# Patient Record
Sex: Female | Born: 1985
Health system: Southern US, Community
[De-identification: ages and names within clinical notes are randomized; demographics above are authoritative.]

## PROBLEM LIST (undated history)

## (undated) DIAGNOSIS — L859 Epidermal thickening, unspecified: Secondary | ICD-10-CM

## (undated) DIAGNOSIS — F419 Anxiety disorder, unspecified: Secondary | ICD-10-CM

## (undated) DIAGNOSIS — Z8719 Personal history of other diseases of the digestive system: Secondary | ICD-10-CM

## (undated) DIAGNOSIS — K219 Gastro-esophageal reflux disease without esophagitis: Secondary | ICD-10-CM

## (undated) DIAGNOSIS — Z973 Presence of spectacles and contact lenses: Secondary | ICD-10-CM

## (undated) DIAGNOSIS — G4700 Insomnia, unspecified: Secondary | ICD-10-CM

## (undated) DIAGNOSIS — K449 Diaphragmatic hernia without obstruction or gangrene: Secondary | ICD-10-CM

## (undated) DIAGNOSIS — Z8619 Personal history of other infectious and parasitic diseases: Secondary | ICD-10-CM

## (undated) DIAGNOSIS — F988 Other specified behavioral and emotional disorders with onset usually occurring in childhood and adolescence: Secondary | ICD-10-CM

## (undated) DIAGNOSIS — A6000 Herpesviral infection of urogenital system, unspecified: Secondary | ICD-10-CM

## (undated) DIAGNOSIS — G44009 Cluster headache syndrome, unspecified, not intractable: Secondary | ICD-10-CM

## (undated) DIAGNOSIS — R350 Frequency of micturition: Secondary | ICD-10-CM

## (undated) DIAGNOSIS — N9089 Other specified noninflammatory disorders of vulva and perineum: Secondary | ICD-10-CM

## (undated) HISTORY — DX: Gastro-esophageal reflux disease without esophagitis: K21.9

## (undated) HISTORY — DX: Other specified behavioral and emotional disorders with onset usually occurring in childhood and adolescence: F98.8

## (undated) HISTORY — DX: Insomnia, unspecified: G47.00

## (undated) HISTORY — DX: Diaphragmatic hernia without obstruction or gangrene: K44.9

## (undated) HISTORY — DX: Anxiety disorder, unspecified: F41.9

## (undated) HISTORY — PX: METATARSAL OSTEOTOMY WITH BUNIONECTOMY: SHX5662

---
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
## 2010-03-25 ENCOUNTER — Other Ambulatory Visit: Payer: Self-pay | Admitting: Family Medicine

## 2010-03-25 ENCOUNTER — Ambulatory Visit
Admission: RE | Admit: 2010-03-25 | Discharge: 2010-03-25 | Disposition: A | Discharge status: Home / Self Care | Payer: PRIVATE HEALTH INSURANCE | Source: Ambulatory Visit | Attending: Family Medicine | Admitting: Family Medicine

## 2010-03-25 DIAGNOSIS — R1012 Left upper quadrant pain: Secondary | ICD-10-CM

## 2010-03-25 MED ORDER — IOHEXOL 300 MG/ML  SOLN
100.0000 mL | Freq: Once | INTRAMUSCULAR | Status: AC | PRN
  Administered 2010-03-25: 100 mL via INTRAVENOUS

## 2010-10-09 ENCOUNTER — Ambulatory Visit (INDEPENDENT_AMBULATORY_CARE_PROVIDER_SITE_OTHER): Payer: Commercial Managed Care - PPO | Admitting: Licensed Clinical Social Worker

## 2010-10-09 DIAGNOSIS — F341 Dysthymic disorder: Secondary | ICD-10-CM

## 2010-10-14 ENCOUNTER — Ambulatory Visit (INDEPENDENT_AMBULATORY_CARE_PROVIDER_SITE_OTHER): Payer: PRIVATE HEALTH INSURANCE | Admitting: Licensed Clinical Social Worker

## 2010-10-14 DIAGNOSIS — F329 Major depressive disorder, single episode, unspecified: Secondary | ICD-10-CM

## 2010-10-28 ENCOUNTER — Ambulatory Visit (INDEPENDENT_AMBULATORY_CARE_PROVIDER_SITE_OTHER): Payer: Commercial Managed Care - PPO | Admitting: Licensed Clinical Social Worker

## 2010-10-28 DIAGNOSIS — F329 Major depressive disorder, single episode, unspecified: Secondary | ICD-10-CM

## 2010-12-10 ENCOUNTER — Ambulatory Visit (HOSPITAL_BASED_OUTPATIENT_CLINIC_OR_DEPARTMENT_OTHER): Payer: 59 | Attending: Family Medicine | Admitting: Radiology

## 2010-12-10 VITALS — Ht 67.0 in | Wt 145.0 lb

## 2010-12-10 DIAGNOSIS — G4710 Hypersomnia, unspecified: Secondary | ICD-10-CM | POA: Insufficient documentation

## 2010-12-10 DIAGNOSIS — R059 Cough, unspecified: Secondary | ICD-10-CM | POA: Insufficient documentation

## 2010-12-10 DIAGNOSIS — R0989 Other specified symptoms and signs involving the circulatory and respiratory systems: Secondary | ICD-10-CM | POA: Insufficient documentation

## 2010-12-10 DIAGNOSIS — R05 Cough: Secondary | ICD-10-CM | POA: Insufficient documentation

## 2010-12-10 DIAGNOSIS — R0609 Other forms of dyspnea: Secondary | ICD-10-CM | POA: Insufficient documentation

## 2010-12-10 DIAGNOSIS — G4730 Sleep apnea, unspecified: Secondary | ICD-10-CM | POA: Insufficient documentation

## 2010-12-10 DIAGNOSIS — Z79899 Other long term (current) drug therapy: Secondary | ICD-10-CM | POA: Insufficient documentation

## 2010-12-14 DIAGNOSIS — G4710 Hypersomnia, unspecified: Secondary | ICD-10-CM

## 2010-12-14 DIAGNOSIS — G4730 Sleep apnea, unspecified: Secondary | ICD-10-CM

## 2010-12-14 DIAGNOSIS — M62838 Other muscle spasm: Secondary | ICD-10-CM

## 2010-12-14 HISTORY — PX: OTHER SURGICAL HISTORY: SHX169

## 2010-12-15 NOTE — Procedures (Addendum)
NAME:  EVELLA, KASAL                  ACCOUNT NO.:  [id number]  MEDICAL RECORD NO.:  [id number]         PATIENT TYPE:  OUT  LOCATION:  SLEEP CENTER                 FACILITY:  [facility]  PHYSICIAN:  Yarelli Decelles D. Maple Hudson, MD, FCCP, FACPDATE OF BIRTH:  08/03/1984  DATE OF STUDY:  12/10/2010                           NOCTURNAL POLYSOMNOGRAM  REFERRING PHYSICIAN:  Molly Maduro A. Nicholos Johns, M.D.  INDICATION FOR STUDY:  Hypersomnia with sleep apnea.  EPWORTH SLEEPINESS SCORE:  Epworth sleepiness score 9/24.  BMI 22.7, weight 145 pounds, height 67 inches, neck 12.5 inch.  Home medications are charted and reviewed.  MEDICATIONS:  SLEEP ARCHITECTURE:  Total sleep time 335 minutes with sleep efficiency 87.2%.  Stage I was 7%, stage II 72.5%, stage III 0.4%, REM 20% of total sleep time.  Sleep latency 17 minutes, REM latency 112.5 minutes, awake after sleep onset 32 minutes, arousal index 14.1.  BEDTIME MEDICATION:  Trazodone and ranitidine.  RESPIRATORY DATA:  Apnea-hypopnea index (AHI) 2.5 per hour.  A total of 14 events were scored including 4 obstructive apneas and 10 hypopneas. All events were associated with supine sleep position.  REM AHI 0.9 per hour, RDI 6.1 per hour.  There were insufficient numbers of events to qualify for application of split protocol, CPAP titration on this study night.  OXYGEN DATA:  Mild snoring with oxygen desaturation to a nadir of 91% and a mean oxygen saturation through the study of 96.4% on room air.  CARDIAC DATA:  Sinus rhythm with rare PVC and PAC.  MOVEMENT-PARASOMNIA:  A few limb jerks were noted with insignificant effect on sleep.  Bathroom x1.  Talking was noted during brief arousals. Frequent coughing noted throughout the study.  IMPRESSIONS-RECOMMENDATIONS: 1. Sleep architecture was significant for a number of brief     nonspecific spontaneous wakings, some associated with sleep talking     and frequent cough. 2. Occasional respiratory event with sleep  disturbance, within normal     limits, AHI 2.5 per hour (normal range for adults is between 0 and     5 events per hour).  Mild snoring with oxygen desaturation to a     nadir of 91% and mean oxygen saturation through the study of 96.4%     on room air which is normal. 3. There were insufficient numbers of scored respiratory events to     qualify for application of split protocol, CPAP titration on this     study night. 4. Consider managing with an emphasis on addressing cough as a basis     for arousal with question of nocturnal reflux or asthma.     Helen Cuff D. Maple Hudson, MD, [facility], FACP Diplomate, Biomedical engineer of Sleep Medicine Electronically Signed    CDY/MEDQ  D:  12/14/2010 09:13:46  T:  12/14/2010 10:15:41  Job:  [id number]

## 2011-03-19 ENCOUNTER — Ambulatory Visit (INDEPENDENT_AMBULATORY_CARE_PROVIDER_SITE_OTHER): Payer: 59 | Admitting: Licensed Clinical Social Worker

## 2011-03-19 DIAGNOSIS — F329 Major depressive disorder, single episode, unspecified: Secondary | ICD-10-CM

## 2011-03-19 DIAGNOSIS — F3289 Other specified depressive episodes: Secondary | ICD-10-CM

## 2011-03-26 ENCOUNTER — Ambulatory Visit (INDEPENDENT_AMBULATORY_CARE_PROVIDER_SITE_OTHER): Payer: 59 | Admitting: Licensed Clinical Social Worker

## 2011-03-26 DIAGNOSIS — F341 Dysthymic disorder: Secondary | ICD-10-CM

## 2011-04-03 ENCOUNTER — Ambulatory Visit (INDEPENDENT_AMBULATORY_CARE_PROVIDER_SITE_OTHER): Payer: 59 | Admitting: Licensed Clinical Social Worker

## 2011-04-03 DIAGNOSIS — F341 Dysthymic disorder: Secondary | ICD-10-CM

## 2011-04-16 ENCOUNTER — Ambulatory Visit (INDEPENDENT_AMBULATORY_CARE_PROVIDER_SITE_OTHER): Payer: 59 | Admitting: Licensed Clinical Social Worker

## 2011-04-16 DIAGNOSIS — F341 Dysthymic disorder: Secondary | ICD-10-CM

## 2011-04-23 ENCOUNTER — Ambulatory Visit (INDEPENDENT_AMBULATORY_CARE_PROVIDER_SITE_OTHER): Payer: 59 | Admitting: Licensed Clinical Social Worker

## 2011-04-23 DIAGNOSIS — F341 Dysthymic disorder: Secondary | ICD-10-CM

## 2011-05-14 ENCOUNTER — Ambulatory Visit (INDEPENDENT_AMBULATORY_CARE_PROVIDER_SITE_OTHER): Payer: 59 | Admitting: Licensed Clinical Social Worker

## 2011-05-14 DIAGNOSIS — F341 Dysthymic disorder: Secondary | ICD-10-CM

## 2011-06-06 ENCOUNTER — Other Ambulatory Visit: Payer: Self-pay | Admitting: Obstetrics and Gynecology

## 2011-06-06 ENCOUNTER — Other Ambulatory Visit (HOSPITAL_COMMUNITY)
Admission: RE | Admit: 2011-06-06 | Discharge: 2011-06-06 | Disposition: A | Discharge status: Home / Self Care | Payer: 59 | Source: Ambulatory Visit | Attending: Obstetrics and Gynecology | Admitting: Obstetrics and Gynecology

## 2011-06-06 DIAGNOSIS — Z113 Encounter for screening for infections with a predominantly sexual mode of transmission: Secondary | ICD-10-CM | POA: Insufficient documentation

## 2011-06-06 DIAGNOSIS — Z01419 Encounter for gynecological examination (general) (routine) without abnormal findings: Secondary | ICD-10-CM | POA: Insufficient documentation

## 2011-06-10 ENCOUNTER — Ambulatory Visit (INDEPENDENT_AMBULATORY_CARE_PROVIDER_SITE_OTHER): Payer: 59 | Admitting: Licensed Clinical Social Worker

## 2011-06-10 DIAGNOSIS — F341 Dysthymic disorder: Secondary | ICD-10-CM

## 2011-06-25 ENCOUNTER — Other Ambulatory Visit: Payer: Self-pay | Admitting: Obstetrics and Gynecology

## 2011-07-02 ENCOUNTER — Ambulatory Visit (INDEPENDENT_AMBULATORY_CARE_PROVIDER_SITE_OTHER): Payer: 59 | Admitting: Licensed Clinical Social Worker

## 2011-07-02 DIAGNOSIS — F341 Dysthymic disorder: Secondary | ICD-10-CM

## 2011-08-06 ENCOUNTER — Ambulatory Visit (INDEPENDENT_AMBULATORY_CARE_PROVIDER_SITE_OTHER): Payer: 59 | Admitting: Licensed Clinical Social Worker

## 2011-08-06 DIAGNOSIS — F341 Dysthymic disorder: Secondary | ICD-10-CM

## 2011-10-07 ENCOUNTER — Ambulatory Visit (INDEPENDENT_AMBULATORY_CARE_PROVIDER_SITE_OTHER): Payer: 59 | Admitting: Licensed Clinical Social Worker

## 2011-10-07 DIAGNOSIS — F341 Dysthymic disorder: Secondary | ICD-10-CM

## 2012-06-09 ENCOUNTER — Other Ambulatory Visit: Payer: Self-pay | Admitting: Obstetrics and Gynecology

## 2012-06-09 ENCOUNTER — Other Ambulatory Visit (HOSPITAL_COMMUNITY)
Admission: RE | Admit: 2012-06-09 | Discharge: 2012-06-09 | Disposition: A | Discharge status: Home / Self Care | Payer: 59 | Source: Ambulatory Visit | Attending: Obstetrics and Gynecology | Admitting: Obstetrics and Gynecology

## 2012-06-09 DIAGNOSIS — Z01419 Encounter for gynecological examination (general) (routine) without abnormal findings: Secondary | ICD-10-CM | POA: Insufficient documentation

## 2012-11-04 ENCOUNTER — Ambulatory Visit (INDEPENDENT_AMBULATORY_CARE_PROVIDER_SITE_OTHER): Payer: 59 | Admitting: Podiatry

## 2012-11-04 ENCOUNTER — Ambulatory Visit (INDEPENDENT_AMBULATORY_CARE_PROVIDER_SITE_OTHER): Payer: 59

## 2012-11-04 ENCOUNTER — Encounter: Payer: Self-pay | Admitting: Podiatry

## 2012-11-04 VITALS — BP 120/45 | HR 74 | Resp 16 | Ht 67.0 in | Wt 157.0 lb

## 2012-11-04 DIAGNOSIS — M21619 Bunion of unspecified foot: Secondary | ICD-10-CM

## 2012-11-04 DIAGNOSIS — R52 Pain, unspecified: Secondary | ICD-10-CM

## 2012-11-04 DIAGNOSIS — M2010 Hallux valgus (acquired), unspecified foot: Secondary | ICD-10-CM

## 2012-11-04 DIAGNOSIS — M7750 Other enthesopathy of unspecified foot: Secondary | ICD-10-CM

## 2012-11-04 DIAGNOSIS — M21611 Bunion of right foot: Secondary | ICD-10-CM

## 2012-11-04 NOTE — Progress Notes (Signed)
Subjective:     Patient ID: Holly Lynch, female   DOB: 1985/05/20, 27 y.o.   MRN: [id number]  HPI patient presents on referral from Dr. Renato Gails stating that my bunion is becoming more painful on my right foot and I get pain in my legs if I walk a lot. States the symptoms have gotten worse over the last several months she's had the bunion deformity for years. Patient has had foot problems for a long time   Review of Systems  All other systems reviewed and are negative.       Objective:   Physical Exam  Nursing note and vitals reviewed. Constitutional: She appears well-developed and well-nourished.  Cardiovascular: Intact distal pulses.   Musculoskeletal: Normal range of motion.  Neurological: She is alert.  Skin: Skin is warm.   muscle strength was found to be adequate of all muscle groups and I noted there is some depression of the arch on weightbearing. Large hyperostosis medial aspect first metatarsal head right over left with redness around the first metatarsal and pain with pressure. 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 Patient would like to get this fixed on the right foot but needs to wait until December. Reviewed the surgery and recovery with 2 weeks of significant reduction of walking. Total recovery will take 6 months to one year. Scanned for custom orthotics to reduce stress on her feet and hopefully prevent the left from progressing

## 2012-11-04 NOTE — Patient Instructions (Signed)
We will call you when orthotics are ready

## 2012-11-11 ENCOUNTER — Telehealth: Payer: Self-pay | Admitting: *Deleted

## 2012-11-11 NOTE — Telephone Encounter (Signed)
"  My supervisor wants me to get FMLA forms filled out in case after surgery.  If I fax them to you can you fill them out?  Give me a call at [phone]."

## 2012-11-25 ENCOUNTER — Other Ambulatory Visit: Payer: Self-pay

## 2012-11-29 ENCOUNTER — Encounter: Payer: Self-pay | Admitting: Podiatry

## 2012-11-29 ENCOUNTER — Ambulatory Visit (INDEPENDENT_AMBULATORY_CARE_PROVIDER_SITE_OTHER): Payer: 59 | Admitting: Podiatry

## 2012-11-29 VITALS — BP 105/60 | HR 82 | Resp 12

## 2012-11-29 DIAGNOSIS — M7750 Other enthesopathy of unspecified foot: Secondary | ICD-10-CM

## 2012-11-29 DIAGNOSIS — M2010 Hallux valgus (acquired), unspecified foot: Secondary | ICD-10-CM

## 2012-11-29 DIAGNOSIS — M21619 Bunion of unspecified foot: Secondary | ICD-10-CM

## 2012-11-29 NOTE — Patient Instructions (Signed)

## 2012-12-01 NOTE — Progress Notes (Signed)
Subjective:     Patient ID: Holly Lynch, female   DOB: 1985/02/27, 27 y.o.   MRN: [id number]  Foot Pain   patient presents stating I'm here for my orthotics and also to schedule the surgery on my right foot. States the bunion has been bothering her quite a bit and the bone on the outside right over left foot   Review of Systems  All other systems reviewed and are negative.       Objective:   Physical Exam  Nursing note and vitals reviewed. Constitutional: She is oriented to person, place, and time.  Cardiovascular: Intact distal pulses.   Musculoskeletal: Normal range of motion.  Neurological: She is oriented to person, place, and time.  Skin: Skin is warm.   patient is found to have structural deformity of the right and left foot with redness and pain around the first metatarsal head and fifth metatarsal head bilateral with x-rays confirming structural malalignment. General health is excellent     Assessment:     HAV deformity bilateral and tailor's bunion deformity bilateral right over left    Plan:     Reviewed condition and today dispensed orthotics with instructions. Patient would like surgery performed in December and at this time I allowed her to read a consent form for correction of the right foot consisting of Austin bunionectomy with pin and fifth metatarsal osteotomy with screw. Reviewed all possible complications that are listed and the fact that total recovery. We'll take 6 months to one year patient understands this and signs consent form. At this time care fracture walker dispensed with all instructions for usage and she is to practice with it prior to the procedures all preoperative instructions given the patient and she is encouraged to call with any questions prior to surgery

## 2012-12-14 ENCOUNTER — Telehealth: Payer: Self-pay | Admitting: *Deleted

## 2012-12-14 NOTE — Telephone Encounter (Signed)
Pt states she faxed her short-term disability paper work to [employer].

## 2012-12-21 ENCOUNTER — Encounter: Payer: Self-pay | Admitting: Podiatry

## 2012-12-21 DIAGNOSIS — M21619 Bunion of unspecified foot: Secondary | ICD-10-CM

## 2012-12-21 DIAGNOSIS — M2010 Hallux valgus (acquired), unspecified foot: Secondary | ICD-10-CM

## 2012-12-22 ENCOUNTER — Telehealth: Payer: Self-pay | Admitting: *Deleted

## 2012-12-22 NOTE — Telephone Encounter (Signed)
Pt states had surgery yesterday, toes are still numb, but capillary refill is good.  I informed pt that she may b numb for 72 hours post-op, that good capillary refill was a good sign, follow the post-op instructions and begin pain medication when discomfort 1st starts.  Pt states understanding.

## 2012-12-27 ENCOUNTER — Encounter: Payer: Self-pay | Admitting: Podiatry

## 2012-12-27 ENCOUNTER — Ambulatory Visit: Payer: 59 | Admitting: Podiatry

## 2012-12-27 ENCOUNTER — Ambulatory Visit (INDEPENDENT_AMBULATORY_CARE_PROVIDER_SITE_OTHER): Payer: 59

## 2012-12-27 VITALS — BP 96/49 | HR 77 | Resp 16

## 2012-12-27 DIAGNOSIS — Z9889 Other specified postprocedural states: Secondary | ICD-10-CM

## 2012-12-27 DIAGNOSIS — M21611 Bunion of right foot: Secondary | ICD-10-CM

## 2012-12-27 DIAGNOSIS — M21619 Bunion of unspecified foot: Secondary | ICD-10-CM

## 2012-12-27 DIAGNOSIS — M2010 Hallux valgus (acquired), unspecified foot: Secondary | ICD-10-CM

## 2012-12-27 NOTE — Progress Notes (Signed)
Subjective:     Patient ID: Holly Lynch, female   DOB: 09/26/1985, 27 y.o.   MRN: [id number]  HPI patient states that I'm doing well with my foot and while having pain it is not as bad as I expect   Review of Systems     Objective:   Physical Exam Neurovascular status intact with negative Homans sign noted. Patient's right foot is healing well with mild edema noted and normal amount of discoloration and bruising with good range of motion first MPJ and good structural clinical alignment    Assessment:     Doing very well 1 week postop structural correction of the forefoot right    Plan:     X-rays reviewed with patient and sterile dressing reapplied along with Darco shoe for continued compression. Patient will continue with reduced activity and be seen back in 3 weeks

## 2013-01-10 ENCOUNTER — Telehealth: Payer: Self-pay | Admitting: *Deleted

## 2013-01-10 NOTE — Telephone Encounter (Signed)
Pt states her foot popped in the joint while she was showering.  Dr Charlsie Merles states go stay in the shoe.  I informed pt and she agreed.

## 2013-01-11 ENCOUNTER — Telehealth: Payer: Self-pay | Admitting: *Deleted

## 2013-01-11 NOTE — Telephone Encounter (Signed)
PT WAS SCHED TO COME IN 12-29 RESCH TO 12-24

## 2013-01-11 NOTE — Telephone Encounter (Signed)
Pt states she feels the toe of the bunion surgery foot is drifting into the 2nd toe.  I referred to the scheduler for an appt with Dr. Charlsie Merles

## 2013-01-12 ENCOUNTER — Ambulatory Visit (INDEPENDENT_AMBULATORY_CARE_PROVIDER_SITE_OTHER): Payer: 59

## 2013-01-12 ENCOUNTER — Ambulatory Visit (INDEPENDENT_AMBULATORY_CARE_PROVIDER_SITE_OTHER): Payer: 59 | Admitting: Podiatry

## 2013-01-12 ENCOUNTER — Encounter: Payer: Self-pay | Admitting: Podiatry

## 2013-01-12 VITALS — BP 118/69 | HR 101 | Resp 16 | Ht 67.0 in | Wt 160.0 lb

## 2013-01-12 DIAGNOSIS — M21611 Bunion of right foot: Secondary | ICD-10-CM

## 2013-01-12 DIAGNOSIS — M2010 Hallux valgus (acquired), unspecified foot: Secondary | ICD-10-CM

## 2013-01-12 DIAGNOSIS — M21619 Bunion of unspecified foot: Secondary | ICD-10-CM

## 2013-01-12 NOTE — Progress Notes (Signed)
Subjective:     Patient ID: Holly Lynch, female   DOB: 10-21-1985, 27 y.o.   MRN: [id number]  HPI patient states that she is doing well after surgery and would like to increase her activities. She has been wearing her surgical shoe and has done some physical activity   Review of Systems     Objective:   Physical Exam Neurovascular status intact with   good structural alignment with the incision sites on the first and fifth metatarsals healing well Assessment:     Doing well post Austin bunionectomy and metatarsal osteotomy fifth right    Plan:     X-rays reviewed and anklet dispensed with instructions on range of motion exercises and gradual usage of tennis shoes at this time. Reappoint 4 weeks earlier if any issues should occur

## 2013-01-17 ENCOUNTER — Encounter: Payer: 59 | Admitting: Podiatry

## 2013-01-31 NOTE — Progress Notes (Signed)
1) Austin bunionectomy right foot  2) Metatarsal osteotomy 5th met right foot

## 2013-02-02 ENCOUNTER — Encounter: Payer: 59 | Admitting: Podiatry

## 2013-02-03 ENCOUNTER — Ambulatory Visit (INDEPENDENT_AMBULATORY_CARE_PROVIDER_SITE_OTHER): Payer: 59 | Admitting: Podiatry

## 2013-02-03 ENCOUNTER — Encounter: Payer: Self-pay | Admitting: Podiatry

## 2013-02-03 ENCOUNTER — Ambulatory Visit (INDEPENDENT_AMBULATORY_CARE_PROVIDER_SITE_OTHER): Payer: 59

## 2013-02-03 VITALS — BP 104/72 | HR 90 | Resp 12

## 2013-02-03 DIAGNOSIS — Z9889 Other specified postprocedural states: Secondary | ICD-10-CM

## 2013-02-03 DIAGNOSIS — M21619 Bunion of unspecified foot: Secondary | ICD-10-CM

## 2013-02-03 DIAGNOSIS — M2010 Hallux valgus (acquired), unspecified foot: Secondary | ICD-10-CM

## 2013-02-03 NOTE — Progress Notes (Signed)
Subjective:     Patient ID: Holly Lynch, female   DOB: 02/04/1985, 28 y.o.   MRN: [id number]  HPI patient presents stating I am doing well with my right foot and would like to resume more normal activity. Several months after foot surgery   Review of Systems     Objective:   Physical Exam Neurovascular status unchanged with incision site right first and fifth metatarsals which are healing well in good range of motion first MPJ right with no crepitus within the joint    Assessment:     Healing well post surgery right foot    Plan:     X-ray taken reviewed and allow this patient to gradually resume more normal activity reappoint for us to recheck

## 2013-02-07 ENCOUNTER — Encounter: Payer: 59 | Admitting: Podiatry

## 2013-04-21 ENCOUNTER — Other Ambulatory Visit: Payer: Self-pay | Admitting: Nurse Practitioner

## 2013-09-15 ENCOUNTER — Encounter (HOSPITAL_COMMUNITY): Payer: Self-pay | Admitting: *Deleted

## 2013-09-15 ENCOUNTER — Telehealth: Payer: Self-pay | Admitting: *Deleted

## 2013-09-15 NOTE — Telephone Encounter (Signed)
I had foot surgery back in December 21, 2012.  I'm starting to have foot pain back in that foot again, same spot before the surgery.  I don't know if I need to come back in or anything.  Dr. Charlsie Merles, Bunion surgery in December, no pain until the last few weeks.  Please give me a call.

## 2013-09-16 NOTE — Telephone Encounter (Signed)
I called and apologized for calling her so late.  I told her she probably needs to schedule an appointment to come in a see Dr. Charlsie Merles.  I asked her to call on Monday to schedule an appointment.  She stated all right.

## 2013-09-19 ENCOUNTER — Encounter (HOSPITAL_COMMUNITY): Payer: Self-pay | Admitting: Pharmacist

## 2013-09-19 NOTE — Telephone Encounter (Signed)
Called patient and left a message for her to call back and schedule an appointment.

## 2013-09-28 ENCOUNTER — Ambulatory Visit (HOSPITAL_COMMUNITY)
Admission: RE | Admit: 2013-09-28 | Discharge: 2013-09-28 | Disposition: A | Discharge status: Home / Self Care | Payer: 59 | Source: Ambulatory Visit | Attending: Obstetrics and Gynecology | Admitting: Obstetrics and Gynecology

## 2013-09-28 ENCOUNTER — Ambulatory Visit (HOSPITAL_COMMUNITY): Payer: 59 | Admitting: Anesthesiology

## 2013-09-28 ENCOUNTER — Ambulatory Visit (INDEPENDENT_AMBULATORY_CARE_PROVIDER_SITE_OTHER): Payer: 59

## 2013-09-28 ENCOUNTER — Ambulatory Visit (INDEPENDENT_AMBULATORY_CARE_PROVIDER_SITE_OTHER): Payer: 59 | Admitting: Podiatry

## 2013-09-28 ENCOUNTER — Other Ambulatory Visit: Payer: Self-pay | Admitting: Podiatry

## 2013-09-28 ENCOUNTER — Encounter: Payer: Self-pay | Admitting: Podiatry

## 2013-09-28 ENCOUNTER — Encounter (HOSPITAL_COMMUNITY): Payer: 59 | Admitting: Anesthesiology

## 2013-09-28 ENCOUNTER — Encounter (HOSPITAL_COMMUNITY)
Admission: RE | Disposition: A | Discharge status: Home / Self Care | Payer: Self-pay | Source: Ambulatory Visit | Attending: Obstetrics and Gynecology

## 2013-09-28 ENCOUNTER — Encounter (HOSPITAL_COMMUNITY): Payer: Self-pay | Admitting: Anesthesiology

## 2013-09-28 VITALS — BP 118/72 | HR 71 | Resp 16

## 2013-09-28 DIAGNOSIS — M21612 Bunion of left foot: Secondary | ICD-10-CM

## 2013-09-28 DIAGNOSIS — F988 Other specified behavioral and emotional disorders with onset usually occurring in childhood and adolescence: Secondary | ICD-10-CM | POA: Diagnosis not present

## 2013-09-28 DIAGNOSIS — A630 Anogenital (venereal) warts: Secondary | ICD-10-CM | POA: Diagnosis present

## 2013-09-28 DIAGNOSIS — K208 Other esophagitis without bleeding: Secondary | ICD-10-CM | POA: Diagnosis not present

## 2013-09-28 DIAGNOSIS — K219 Gastro-esophageal reflux disease without esophagitis: Secondary | ICD-10-CM | POA: Diagnosis not present

## 2013-09-28 DIAGNOSIS — A6000 Herpesviral infection of urogenital system, unspecified: Secondary | ICD-10-CM | POA: Insufficient documentation

## 2013-09-28 DIAGNOSIS — N898 Other specified noninflammatory disorders of vagina: Secondary | ICD-10-CM | POA: Diagnosis not present

## 2013-09-28 DIAGNOSIS — F329 Major depressive disorder, single episode, unspecified: Secondary | ICD-10-CM | POA: Insufficient documentation

## 2013-09-28 DIAGNOSIS — M2010 Hallux valgus (acquired), unspecified foot: Secondary | ICD-10-CM

## 2013-09-28 DIAGNOSIS — M2012 Hallux valgus (acquired), left foot: Secondary | ICD-10-CM

## 2013-09-28 DIAGNOSIS — M2011 Hallux valgus (acquired), right foot: Secondary | ICD-10-CM

## 2013-09-28 DIAGNOSIS — M2040 Other hammer toe(s) (acquired), unspecified foot: Secondary | ICD-10-CM

## 2013-09-28 DIAGNOSIS — F3289 Other specified depressive episodes: Secondary | ICD-10-CM | POA: Insufficient documentation

## 2013-09-28 DIAGNOSIS — M21619 Bunion of unspecified foot: Secondary | ICD-10-CM

## 2013-09-28 HISTORY — PX: BIOPSY: SHX5522

## 2013-09-28 HISTORY — PX: LASER ABLATION CONDOLAMATA: SHX5941

## 2013-09-28 LAB — CBC
HEMATOCRIT: 37 % (ref 36.0–46.0)
Hemoglobin: 11.5 g/dL — ABNORMAL LOW (ref 12.0–15.0)
MCH: 24.7 pg — ABNORMAL LOW (ref 26.0–34.0)
MCHC: 31.1 g/dL (ref 30.0–36.0)
MCV: 79.6 fL (ref 78.0–100.0)
Platelets: 379 10*3/uL (ref 150–400)
RBC: 4.65 MIL/uL (ref 3.87–5.11)
RDW: 16 % — ABNORMAL HIGH (ref 11.5–15.5)
WBC: 3.9 10*3/uL — AB (ref 4.0–10.5)

## 2013-09-28 LAB — PREGNANCY, URINE: Preg Test, Ur: NEGATIVE

## 2013-09-28 SURGERY — ABLATION, CONDYLOMA, USING LASER
Anesthesia: General | Site: Vulva

## 2013-09-28 MED ORDER — MEPERIDINE HCL 25 MG/ML IJ SOLN: 6.2500 mg | INTRAMUSCULAR | Status: DC | PRN

## 2013-09-28 MED ORDER — IODINE STRONG (LUGOLS) 5 % PO SOLN
ORAL | Status: AC
  Filled 2013-09-28: qty 1

## 2013-09-28 MED ORDER — EPHEDRINE SULFATE 50 MG/ML IJ SOLN
INTRAMUSCULAR | Status: DC | PRN
  Administered 2013-09-28: 10 mg via INTRAVENOUS

## 2013-09-28 MED ORDER — OXYCODONE-ACETAMINOPHEN 5-325 MG PO TABS: 1.0000 | ORAL_TABLET | ORAL | Status: DC | PRN

## 2013-09-28 MED ORDER — KETOROLAC TROMETHAMINE 30 MG/ML IJ SOLN
INTRAMUSCULAR | Status: DC | PRN
  Administered 2013-09-28: 30 mg via INTRAVENOUS

## 2013-09-28 MED ORDER — SILVER SULFADIAZINE 1 % EX CREA
TOPICAL_CREAM | CUTANEOUS | Status: DC | PRN
  Administered 2013-09-28: 1 via TOPICAL

## 2013-09-28 MED ORDER — FENTANYL CITRATE 0.05 MG/ML IJ SOLN
INTRAMUSCULAR | Status: AC
  Filled 2013-09-28: qty 2

## 2013-09-28 MED ORDER — HYDROMORPHONE HCL PF 1 MG/ML IJ SOLN
INTRAMUSCULAR | Status: AC
  Filled 2013-09-28: qty 1

## 2013-09-28 MED ORDER — HYDROMORPHONE HCL PF 1 MG/ML IJ SOLN
INTRAMUSCULAR | Status: DC | PRN
  Administered 2013-09-28: 1 mg via INTRAVENOUS

## 2013-09-28 MED ORDER — LIDOCAINE HCL (CARDIAC) 20 MG/ML IV SOLN
INTRAVENOUS | Status: AC
  Filled 2013-09-28: qty 5

## 2013-09-28 MED ORDER — MIDAZOLAM HCL 2 MG/2ML IJ SOLN
INTRAMUSCULAR | Status: AC
  Filled 2013-09-28: qty 2

## 2013-09-28 MED ORDER — ONDANSETRON HCL 4 MG/2ML IJ SOLN
INTRAMUSCULAR | Status: DC | PRN
  Administered 2013-09-28: 4 mg via INTRAVENOUS

## 2013-09-28 MED ORDER — KETOROLAC TROMETHAMINE 30 MG/ML IJ SOLN
INTRAMUSCULAR | Status: AC
  Filled 2013-09-28: qty 1

## 2013-09-28 MED ORDER — ONDANSETRON HCL 4 MG/2ML IJ SOLN: 4.0000 mg | Freq: Once | INTRAMUSCULAR | Status: DC | PRN

## 2013-09-28 MED ORDER — BUPIVACAINE HCL 0.25 % IJ SOLN
INTRAMUSCULAR | Status: DC | PRN
  Administered 2013-09-28: 7 mL

## 2013-09-28 MED ORDER — FENTANYL CITRATE 0.05 MG/ML IJ SOLN
INTRAMUSCULAR | Status: DC | PRN
  Administered 2013-09-28 (×2): 50 ug via INTRAVENOUS

## 2013-09-28 MED ORDER — DEXAMETHASONE SODIUM PHOSPHATE 4 MG/ML IJ SOLN
INTRAMUSCULAR | Status: AC
  Filled 2013-09-28: qty 1

## 2013-09-28 MED ORDER — LIDOCAINE HCL (CARDIAC) 20 MG/ML IV SOLN
INTRAVENOUS | Status: DC | PRN
  Administered 2013-09-28: 30 mg via INTRAVENOUS
  Administered 2013-09-28: 70 mg via INTRAVENOUS

## 2013-09-28 MED ORDER — PROPOFOL 10 MG/ML IV BOLUS
INTRAVENOUS | Status: DC | PRN
  Administered 2013-09-28: 170 mg via INTRAVENOUS

## 2013-09-28 MED ORDER — KETOROLAC TROMETHAMINE 30 MG/ML IJ SOLN: 15.0000 mg | Freq: Once | INTRAMUSCULAR | Status: DC | PRN

## 2013-09-28 MED ORDER — FENTANYL CITRATE 0.05 MG/ML IJ SOLN: 25.0000 ug | INTRAMUSCULAR | Status: DC | PRN

## 2013-09-28 MED ORDER — MIDAZOLAM HCL 2 MG/2ML IJ SOLN
INTRAMUSCULAR | Status: DC | PRN
  Administered 2013-09-28: 2 mg via INTRAVENOUS

## 2013-09-28 MED ORDER — BUPIVACAINE HCL (PF) 0.25 % IJ SOLN
INTRAMUSCULAR | Status: AC
Start: 2013-09-28 — End: 2013-09-28
  Filled 2013-09-28: qty 30

## 2013-09-28 MED ORDER — ONDANSETRON HCL 4 MG/2ML IJ SOLN
INTRAMUSCULAR | Status: AC
  Filled 2013-09-28: qty 2

## 2013-09-28 MED ORDER — MEPERIDINE HCL 50 MG PO TABS: 50.0000 mg | ORAL_TABLET | ORAL | Status: DC | PRN

## 2013-09-28 MED ORDER — CEFAZOLIN SODIUM-DEXTROSE 2-3 GM-% IV SOLR
2.0000 g | INTRAVENOUS | Status: AC
  Administered 2013-09-28: 2 g via INTRAVENOUS

## 2013-09-28 MED ORDER — DEXAMETHASONE SODIUM PHOSPHATE 10 MG/ML IJ SOLN
INTRAMUSCULAR | Status: DC | PRN
  Administered 2013-09-28: 4 mg via INTRAVENOUS

## 2013-09-28 MED ORDER — SCOPOLAMINE 1 MG/3DAYS TD PT72: 1.0000 | MEDICATED_PATCH | Freq: Once | TRANSDERMAL | Status: DC

## 2013-09-28 MED ORDER — EPHEDRINE 5 MG/ML INJ
INTRAVENOUS | Status: AC
  Filled 2013-09-28: qty 10

## 2013-09-28 MED ORDER — LACTATED RINGERS IV SOLN
INTRAVENOUS | Status: DC
  Administered 2013-09-28 (×2): via INTRAVENOUS

## 2013-09-28 MED ORDER — SILVER NITRATE-POT NITRATE 75-25 % EX MISC
CUTANEOUS | Status: AC
  Filled 2013-09-28: qty 1

## 2013-09-28 MED ORDER — ACETIC ACID 5 % SOLN
Status: AC
  Filled 2013-09-28: qty 500

## 2013-09-28 MED ORDER — IBUPROFEN 600 MG PO TABS: 600.0000 mg | ORAL_TABLET | Freq: Four times a day (QID) | ORAL | Status: DC | PRN

## 2013-09-28 MED ORDER — FERRIC SUBSULFATE 259 MG/GM EX SOLN
CUTANEOUS | Status: AC
  Filled 2013-09-28: qty 8

## 2013-09-28 MED ORDER — SILVER SULFADIAZINE 1 % EX CREA
TOPICAL_CREAM | CUTANEOUS | Status: AC
  Filled 2013-09-28: qty 50

## 2013-09-28 MED ORDER — SILVER NITRATE-POT NITRATE 75-25 % EX MISC
CUTANEOUS | Status: DC | PRN
  Administered 2013-09-28 (×2): 1

## 2013-09-28 MED ORDER — CEFAZOLIN SODIUM-DEXTROSE 2-3 GM-% IV SOLR
INTRAVENOUS | Status: AC
  Filled 2013-09-28: qty 50

## 2013-09-28 SURGICAL SUPPLY — 21 items
APPLICATOR COTTON TIP 6IN STRL (MISCELLANEOUS) ×8 IMPLANT
CATH ROBINSON RED A/P 16FR (CATHETERS) ×4 IMPLANT
CLOTH BEACON ORANGE TIMEOUT ST (SAFETY) ×4 IMPLANT
DEPRESSOR TONGUE BLADE STERILE (MISCELLANEOUS) ×8 IMPLANT
DRSG TELFA 3X8 NADH (GAUZE/BANDAGES/DRESSINGS) ×4 IMPLANT
GLOVE BIO SURGEON STRL SZ7 (GLOVE) ×4 IMPLANT
GLOVE BIOGEL PI IND STRL 7.0 (GLOVE) ×2 IMPLANT
GLOVE BIOGEL PI INDICATOR 7.0 (GLOVE) ×2
GOWN STRL REUS W/TWL LRG LVL3 (GOWN DISPOSABLE) ×8 IMPLANT
HOSE NS SMOKE EVAC 7/8 X6 (MISCELLANEOUS) ×3 IMPLANT
HOSE NS SMOKE EVAC 7/8 X6' (MISCELLANEOUS) ×1
PACK VAGINAL MINOR WOMEN LF (CUSTOM PROCEDURE TRAY) ×4 IMPLANT
PAD OB MATERNITY 4.3X12.25 (PERSONAL CARE ITEMS) ×4 IMPLANT
PAD PREP 24X48 CUFFED NSTRL (MISCELLANEOUS) ×4 IMPLANT
SCOPETTES 8  STERILE (MISCELLANEOUS) ×4
SCOPETTES 8 STERILE (MISCELLANEOUS) ×4 IMPLANT
SUT VIC AB 2-0 SH 27 (SUTURE) ×2
SUT VIC AB 2-0 SH 27XBRD (SUTURE) ×2 IMPLANT
TOWEL OR 17X24 6PK STRL BLUE (TOWEL DISPOSABLE) ×8 IMPLANT
TUBING SMOKE EVAC HOSE ADAPTER (MISCELLANEOUS) ×4 IMPLANT
YANKAUER SUCT BULB TIP NO VENT (SUCTIONS) ×4 IMPLANT

## 2013-09-28 NOTE — Interval H&P Note (Signed)
History and Physical Interval Note:  09/28/2013 1:26 PM  Holly Lynch  has presented today for surgery, with the diagnosis of Genital Warts  The various methods of treatment have been discussed with the patient and family. After consideration of risks, benefits and other options for treatment, the patient has consented to  Procedure(s): CO2 LASER ABLATION  (N/A) CO2 LASER APPLICATION (N/A) as a surgical intervention .  The patient's history has been reviewed, patient examined, no change in status, stable for surgery.  I have reviewed the patient's chart and labs.  Questions were answered to the patient's satisfaction.     Dion Body, Eames Dibiasio

## 2013-09-28 NOTE — Discharge Instructions (Signed)
Excuse from Work Holly Lynch needs to be excused from: __x___ Work _____ [employer] _____ Physical activity Beginning now and through the following date: Friday, September 30, 2013  She may return to full physical activity as of:  Sunday, October 02, 2013 Caregiver's signature: ________________________________________  Date: ______________________________________________________   Document Released: 07/02/2000 Document Revised: 03/31/2011 Document Reviewed: 01/06/2005 ExitCare Patient Information 2015 [place], [place]. This information is not intended to replace advice given to you by your health care provider. Make sure you discuss any questions you have with your health care provider.

## 2013-09-28 NOTE — Transfer of Care (Signed)
Immediate Anesthesia Transfer of Care Note  Patient: Holly Lynch  Procedure(s) Performed: Procedure(s): CO2 LASER ABLATION CONDYLOMA (N/A) VULVAR BIOPSY (N/A)  Patient Location: PACU  Anesthesia Type:General  Level of Consciousness: awake, alert  and oriented  Airway & Oxygen Therapy: Patient Spontanous Breathing and Patient connected to nasal cannula oxygen  Post-op Assessment: Report given to PACU RN and Post -op Vital signs reviewed and stable  Post vital signs: Reviewed and stable  Complications: No apparent anesthesia complications

## 2013-09-28 NOTE — Anesthesia Postprocedure Evaluation (Signed)
Anesthesia Post Note  Patient: Holly Lynch  Procedure(s) Performed: Procedure(s) (LRB): CO2 LASER ABLATION CONDYLOMA (N/A) VULVAR BIOPSY (N/A)  Anesthesia type: General  Patient location: PACU  Post pain: Pain level controlled  Post assessment: Post-op Vital signs reviewed  Last Vitals:  Filed Vitals:   09/28/13 1234  BP: 121/65  Pulse: 61  Temp: 36.6 C  Resp: 17    Post vital signs: Reviewed  Level of consciousness: sedated  Complications: No apparent anesthesia complications

## 2013-09-28 NOTE — H&P (Signed)
History of Present Illness  General:  28 y/o presents for preop for CO2 Laser Ablation of condyloma. 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 Warts have remained present despite TCA and Aldara. Pt desires definitive management.   Current Medications  Taking   Ranitidine HCl 300 MG Tablet 1 tablet at bedtime   Sumatriptan Succinate 100 mg Tablet 1 tablet Immediately at the onset of headache. May repeat in 2 hours for persistent headache pain   ibuprofen 1 tab   Excedrin Migraine 250-250-65 MG Tablet 2 tablets as needed for pain every 6 hrs, Notes: prn   Valtrex 1 GM Tablet 1 tablet once a day, Notes: daily   Trazodone HCl 100 mg Tablet 1/2 tablet at bedtime   Pantoprazole Sodium 40 MG Tablet Delayed Release 1 tablet Once a day   Hyoscyamine Sulfate 0.125 MG Tablet 1-2 tablet(s) every 4 hrs, prn abdominal cramping   Vyvanse 70 MG Capsule 1 capsule q AM   Citalopram Hydrobromide 40 MG Tablet 1 tablet Once a day   Nortrel 1/35 (21) 1-35 MG-MCG Tablet 1 tablet Once a day   Not-Taking/PRN   Aldara 5 % Cream 1 application to affected area before going to bed Three times a Week, Notes: prn   Medication List reviewed and reconciled with the patient   Past Medical History  Depression  GERD  ADD  grade B esophagitis  Cluster headaches  Genital herpes  Condyloma  Surgical History  tonsillectomy 2000  wisdom teeth extract 1998  colonoscopy 2009 & 2013  right bunionectomy 12/2012  Family History  Father: alive  Mother: alive, endometrosis, MD  Paternal Grand Father: alive, unknown  Paternal Grand Mother: alive  Maternal Grand Father: alive, heart sdisease, abdominal aneurysm  Maternal Grand Mother: alive, MD, eye disease-lost eye  Brother 1: alive, Narclepsy, ADD  Brother2: alive, ADD  Sister 1: alive, endometrosis  Sister 2: alive, bipolar type II, depression, hypothyroid, IC  denies any GYN family cancer hx younger brother daughter died of Epstein Anomaly.  Social  History  General:  Tobacco use  cigarettes: Never smoked Tobacco history last updated 09/20/2013 no Smoking.  no Alcohol, social.  no Recreational drug use.  Exercise: 3-4 x week.  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  Marital Status: single.  Children: none.   Gyn History  Sexual activity currently sexually active.  Periods : every month.  LMP 3 weeks ago.  Birth control Nortrel.  Last pap smear date 5.21.14 WNL.  Denies H/O Last mammogram date.  Denies H/O Abnormal pap smear.  STD HPV, Condyloma, Herpes.  Menarche 13.   OB History  Never been pregnant per patient.   Allergies  N.K.D.A.  Hospitalization/Major Diagnostic Procedure  Denies Past Hospitalization  Review of Systems  Denies fever/chills, chest pain, SOB, headaches, numbness/tingling. No h/o complication with anesthesia, bleeding disorders or blood clots.  Vital Signs  Wt 168, Wt change -1 lb, Ht 67, BMI 26.31, BP sitting 120/78.  Physical Examination  GENERAL:  Patient appears alert and oriented.  General Appearance: well-appearing, well-developed, no acute distress.  Speech: clear.  LUNGS:  Auscultation: no wheezing/rhonchi/rales. CTA bilaterally.  HEART:  Heart sounds: normal. RRR. no murmur.  ABDOMEN:  General: soft nontender, nondistended, no masses.  FEMALE GENITOURINARY:  Pelvic right of clitoris 1x1 cm white, slightly raised cobblestone appearance, vagina white discharge, no odor.  EXTREMITIES:  General: No edema or calf tenderness.    Assessments  1. Pre-op exam - V72.84 (Primary)  2.  Vaginal discharge - 623.5  Treatment  1. Pre-op exam  Notes: Recommend vulvar biopsy to rule out VIN 1 due to condyloma x 2 years. R/B/A of procedure discussed with pt at length. All questions answered. Consent obtained.    2. Vaginal discharge  LAB: [facility]  Lab: [facility] Negative  WBCS Slightly increased  -   CLUE CELLS Moderate  -   TRICHOMONAS None Seen  -   YEAST  None seen  -   OTHER: Abundant Bacteria  -   Zariah Cavendish B 09/22/2013 07:29:05 AM > Normal discharge, no yeast seen. Allman,Michelle 09/22/2013 11:15:33 AM > Left a detailed message.         Procedure Codes  40981 ECL WET PREP   Follow Up  2 Weeks post op

## 2013-09-28 NOTE — Anesthesia Preprocedure Evaluation (Signed)
Anesthesia Evaluation  Patient identified by MRN, date of birth, ID band Patient awake    Reviewed: Allergy & Precautions, H&P , Patient's Chart, lab work & pertinent test results, reviewed documented beta blocker date and time   History of Anesthesia Complications Negative for: history of anesthetic complications  Airway Mallampati: II TM Distance: >3 FB Neck ROM: full    Dental   Pulmonary  breath sounds clear to auscultation        Cardiovascular Exercise Tolerance: Good Rhythm:regular Rate:Normal     Neuro/Psych  Headaches, PSYCHIATRIC DISORDERS Anxiety negative psych ROS   GI/Hepatic GERD-  ,  Endo/Other    Renal/GU      Musculoskeletal   Abdominal   Peds  Hematology   Anesthesia Other Findings   Reproductive/Obstetrics                           Anesthesia Physical Anesthesia Plan  ASA: II  Anesthesia Plan: General LMA   Post-op Pain Management:    Induction:   Airway Management Planned:   Additional Equipment:   Intra-op Plan:   Post-operative Plan:   Informed Consent: I have reviewed the patients History and Physical, chart, labs and discussed the procedure including the risks, benefits and alternatives for the proposed anesthesia with the patient or authorized representative who has indicated his/her understanding and acceptance.   Dental Advisory Given  Plan Discussed with: CRNA, Surgeon and Anesthesiologist  Anesthesia Plan Comments:         Anesthesia Quick Evaluation

## 2013-09-28 NOTE — Patient Instructions (Signed)

## 2013-09-29 ENCOUNTER — Encounter (HOSPITAL_COMMUNITY): Payer: Self-pay | Admitting: Obstetrics and Gynecology

## 2013-09-29 NOTE — Progress Notes (Signed)
Subjective:     Patient ID: Holly Lynch, female   DOB: [birth year], 28 y.o.   MRN: [id number]  HPI patient presents stating I was getting some pain in my right foot a mild nature and I just wanted to make sure the surgeries okay and I know I'm getting need to consider surgery on my left foot   Review of Systems     Objective:   Physical Exam Neurovascular status intact muscle strength adequate with patient found to have well-healing surgical sites first and fifth metatarsal right with excellent range of motion of the first MPJ with no crepitus and minimal discomfort upon palpation and is found to have significant structural malalignment of the left foot with first and fifth metatarsals affected along with the distal fourth toe    Assessment:     Probable increased activity creating low grade inflammation right foot and structural deformity of the left foot that we'll need to be addressed    Plan:     Reviewed conditions and reviewed x-rays. At this time I placed on anti-inflammatory diclofenac 75 mg to reduce inflammation and I went ahead and educated her on Austin bunionectomy left that osteotomy fifth left and distal deformity correction fourth toe left foot. She wants to get this done but cannot hold off currently until her schedule is more open for this type procedure

## 2013-10-05 NOTE — Brief Op Note (Signed)
09/28/2013  6:53 AM  PATIENT:  Francis Gaines  28 y.o. female  PRE-OPERATIVE DIAGNOSIS:  Genital Warts  POST-OPERATIVE DIAGNOSIS:  Genital Warts  PROCEDURE:  Procedure(s): CO2 LASER ABLATION CONDYLOMA (N/A) VULVAR BIOPSY (N/A)  SURGEON:  Surgeon(s) and Role:    * Geryl Rankins, MD - Primary  PHYSICIAN ASSISTANT: None  ASSISTANTS: Technician   ANESTHESIA:   general  EBL:   Minimal  BLOOD ADMINISTERED:none  DRAINS: none   LOCAL MEDICATIONS USED:  MARCAINE     SPECIMEN:  Source of Specimen:  Right labia minora  DISPOSITION OF SPECIMEN:  PATHOLOGY  COUNTS:  YES  TOURNIQUET:  * No tourniquets in log *  DICTATION: .Other Dictation: Dictation Number [id number]  PLAN OF CARE: Discharge to home after PACU  PATIENT DISPOSITION:  PACU - hemodynamically stable.   Delay start of Pharmacological VTE agent (>24hrs) due to surgical blood loss or risk of bleeding: not applicable

## 2013-10-06 NOTE — Op Note (Signed)
Holly Lynch, Holly Lynch                  ACCOUNT NO.:  [id number]  MEDICAL RECORD NO.:  [id number]  LOCATION:                                 FACILITY:  PHYSICIAN:  Holly Partridge, MD        DATE OF BIRTH:  DATE OF PROCEDURE:  09/28/2013 DATE OF DISCHARGE:                              OPERATIVE REPORT   PREOPERATIVE DIAGNOSIS:  Genital warts.  PROCEDURE:  CO2 laser ablation of condyloma and vulvar biopsy, punch biopsy.  SURGEON:  Shela Nevin. Dion Body, MD  ASSISTANT:  Technician.  ANESTHESIA:  General (LMA).  BLOOD ADMINISTERED:  None.  Local Marcaine.  SPECIMEN:  Right labia minora biopsy.  DISPOSITION OF SPECIMEN:  To Pathology.  Plan of care is to discharge home after PACU.  DISPOSITION:  To PACU hemodynamically stable.  COMPLICATIONS:  None.  FINDINGS:  The patient had thickened condyloma visualized approximately 3 mm to the patient's right of the clitoris.  The affected area of approximately 2 x 2 cm.  PROCEDURE IN DETAIL:  [name] was identified in the holding area.  She was then taken to the operating room, where she underwent LMA anesthesia without complication.  She was then placed in the dorsal lithotomy position and prepped and draped in a normal sterile fashion.  The patient was draped with wet blue towels and then the traditional draping.  The area had been prepped with Betadine.  I then did a 4-mm punch biopsy of the area for tissue diagnosis.  There was minimal bleeding with that and the tissue was significantly thickened.  The laser was set up 10 watts, was used that was tested on a tongue blade.  Appropriate precautions were taken in the room, everyone had appropriate mask and goggles.  Typical protocol was used to ensure the safety of the use of the laser.  The area was ablated without complication.  The patient had I guess a __________floppy labia that were somewhat __________  self-retaining retractors, so the assistant held the retractors to  hold the labia back that continued to slip which we could not afford to do during the time of the use of the laser.  Also the labia were tagged with 0 Vicryl to the inner thigh.  Though, assistant still had to hold with the 2 prong retractors but it did not slip.  Once the area was adequately ablated, silver nitrate was used for hemostasis.  I injected Marcaine at the end of the procedure.  There was some infiltration of the periclitoral space at that point, but there was no bleeding.  Silvadene was then applied to the wound.  Ice pack was applied.  All instrument, sponge, and needle counts were correct x3.  The patient tolerated the procedure well.  She went to the recovery room in stable condition.  She received antibiotics.  She received Ancef prior to the procedure.  SCDs were on the entire time when operating.  Time-out was taken before the procedure was performed.     Holly Partridge, MD     EBV/MEDQ  D:  10/05/2013  T:  10/05/2013  Job:  [id number]

## 2013-12-26 ENCOUNTER — Encounter (HOSPITAL_COMMUNITY): Payer: Self-pay | Admitting: Obstetrics and Gynecology

## 2014-02-13 ENCOUNTER — Telehealth: Payer: Self-pay | Admitting: *Deleted

## 2014-02-13 NOTE — Telephone Encounter (Signed)
Pt called to schedule her next bunion surgery with Dr. Charlsie Merlesegal, and would like 05/17/2014.  Please call to confirm.

## 2014-02-15 NOTE — Telephone Encounter (Signed)
I returned her call.  "I think I've already got it resolved.  I spoke to a lady yesterday and I scheduled an appointment to come in on February 1 to discuss the surgery.

## 2014-02-20 ENCOUNTER — Ambulatory Visit (INDEPENDENT_AMBULATORY_CARE_PROVIDER_SITE_OTHER): Payer: 59 | Admitting: Podiatry

## 2014-02-20 ENCOUNTER — Ambulatory Visit: Payer: 59

## 2014-02-20 ENCOUNTER — Encounter: Payer: Self-pay | Admitting: Podiatry

## 2014-02-20 ENCOUNTER — Ambulatory Visit (INDEPENDENT_AMBULATORY_CARE_PROVIDER_SITE_OTHER): Payer: 59

## 2014-02-20 VITALS — BP 118/69 | HR 78 | Resp 16

## 2014-02-20 DIAGNOSIS — M2012 Hallux valgus (acquired), left foot: Secondary | ICD-10-CM

## 2014-02-20 DIAGNOSIS — M2042 Other hammer toe(s) (acquired), left foot: Secondary | ICD-10-CM

## 2014-02-20 DIAGNOSIS — M21612 Bunion of left foot: Secondary | ICD-10-CM

## 2014-02-20 NOTE — Patient Instructions (Signed)
Pre-Operative Instructions  Congratulations, you have decided to take an important step to improving your quality of life.  You can be assured that the doctors of [facility] will be with you every step of the way.  1. Plan to be at the surgery center/hospital at least 1 (one) hour prior to your scheduled time unless otherwise directed by the surgical center/hospital staff.  You must have a responsible adult accompany you, remain during the surgery and drive you home.  Make sure you have directions to the surgical center/hospital and know how to get there on time. 2. For hospital based surgery you will need to obtain a history and physical form from your family physician within 1 month prior to the date of surgery- we will give you a form for you primary physician.  3. We make every effort to accommodate the date you request for surgery.  There are however, times where surgery dates or times have to be moved.  We will contact you as soon as possible if a change in schedule is required.   4. No Aspirin/Ibuprofen for one week before surgery.  If you are on aspirin, any non-steroidal anti-inflammatory medications (Mobic, Aleve, Ibuprofen) you should stop taking it 7 days prior to your surgery.  You make take Tylenol  For pain prior to surgery.  5. Medications- If you are taking daily heart and blood pressure medications, seizure, reflux, allergy, asthma, anxiety, pain or diabetes medications, make sure the surgery center/hospital is aware before the day of surgery so they may notify you which medications to take or avoid the day of surgery. 6. No food or drink after midnight the night before surgery unless directed otherwise by surgical center/hospital staff. 7. No alcoholic beverages 24 hours prior to surgery.  No smoking 24 hours prior to or 24 hours after surgery. 8. Wear loose pants or shorts- loose enough to fit over bandages, boots, and casts. 9. No slip on shoes, sneakers are best. 10. Bring  your boot with you to the surgery center/hospital.  Also bring crutches or a walker if your physician has prescribed it for you.  If you do not have this equipment, it will be provided for you after surgery. 11. If you have not been contracted by the surgery center/hospital by the day before your surgery, call to confirm the date and time of your surgery. 12. Leave-time from work may vary depending on the type of surgery you have.  Appropriate arrangements should be made prior to surgery with your employer. 13. Prescriptions will be provided immediately following surgery by your doctor.  Have these filled as soon as possible after surgery and take the medication as directed. 14. Remove nail polish on the operative foot. 15. Wash the night before surgery.  The night before surgery wash the foot and leg well with the antibacterial soap provided and water paying special attention to beneath the toenails and in between the toes.  Rinse thoroughly with water and dry well with a towel.  Perform this wash unless told not to do so by your physician.  Enclosed: 1 Ice pack (please put in freezer the night before surgery)   1 Hibiclens skin cleaner   Pre-op Instructions  If you have any questions regarding the instructions, do not hesitate to call our office.  [place]: [address] , [place] [address] [phone]  [place]: [address] [place], [place] [address] [phone]  [place]: [address]  [place],  [address] [phone]  Dr. Richard   Tuchman DPM, Dr. Norman Regal DPM Dr. Richard Sikora DPM, Dr. M. Todd Hyatt DPM, Dr. Kathryn Egerton DPM 

## 2014-02-22 ENCOUNTER — Telehealth: Payer: Self-pay | Admitting: *Deleted

## 2014-02-22 NOTE — Telephone Encounter (Signed)
I called and asked the patient if she could do surgery on Tuesday 05/16/14, someone had given you the date of 05/17/14.  Dr. Charlsie Merlesegal does surgery on Tuesday.  "Yes, that is fine."

## 2014-02-22 NOTE — Progress Notes (Signed)
Subjective:     Patient ID: Holly Lynch, female   DOB: 1985-07-08, 29 y.o.   MRN: [id number]  HPI patient presents stating I am ready to get my left foot fixed and I'm satisfied with the right but I still gets some swelling and wanted to get it checked   Review of Systems     Objective:   Physical Exam Neurovascular status intact with muscle strength adequate range of motion within normal limits and patient noted to have significant flattening of the arch on both feet. Patient's noted to have large structural deformity first metatarsal head left with redness and deviation of the hallux against the second toe along with prominence of the fifth metatarsal head left and significant distal deviation of the fourth toe left. 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 Patient wants surgery and I explained the surgery and allowed her to read consent form. She will have Austin with pin fixation metatarsal osteotomy with screw and distal arthroplasty fourth toe left foot. She understands all alternative treatments and risks as listed and wants surgery and signs consent form at this time. Understands total recovery period is generally from 6 months to one year and she is given all preoperative instructions and is scheduled for surgery and is encouraged to call with questions prior to procedure

## 2014-04-19 DIAGNOSIS — R52 Pain, unspecified: Secondary | ICD-10-CM

## 2014-04-26 ENCOUNTER — Telehealth: Payer: Self-pay | Admitting: *Deleted

## 2014-04-26 NOTE — Telephone Encounter (Signed)
"  Please call."  I returned her call.  "I'm calling to follow-up about my disability forms.  I called to inquire about it and was informed it was still pending.  I just want to make sure it's being taken care of."  You'll need to speak to Marylu LundJanet, she takes care of disability.  I get her to call you tomorrow.  "Okay, thank you."

## 2014-05-04 ENCOUNTER — Telehealth: Payer: Self-pay | Admitting: *Deleted

## 2014-05-09 NOTE — Telephone Encounter (Signed)
"  I have bunion surgery on 05/16/2014.  I want to go out of town.  I'll be out of work 6 weeks.  Will I need like a 5 week checkup?"    I left her a message that Dr. Charlsie Merlesegal will have her back for an appointment the week after surgery.  Then he will have you back the following week for a suture removal and then probably around 1st week or two of June.  If you have any further questions give me a call.

## 2014-05-16 ENCOUNTER — Telehealth: Payer: Self-pay | Admitting: *Deleted

## 2014-05-16 DIAGNOSIS — M21542 Acquired clubfoot, left foot: Secondary | ICD-10-CM | POA: Diagnosis not present

## 2014-05-16 DIAGNOSIS — M2012 Hallux valgus (acquired), left foot: Secondary | ICD-10-CM | POA: Diagnosis not present

## 2014-05-16 DIAGNOSIS — M2042 Other hammer toe(s) (acquired), left foot: Secondary | ICD-10-CM | POA: Diagnosis not present

## 2014-05-16 NOTE — Telephone Encounter (Signed)
[employer] Disability representative request surgical information for Claim [id number].

## 2014-05-22 ENCOUNTER — Ambulatory Visit (INDEPENDENT_AMBULATORY_CARE_PROVIDER_SITE_OTHER): Payer: 59 | Admitting: Podiatry

## 2014-05-22 ENCOUNTER — Ambulatory Visit (INDEPENDENT_AMBULATORY_CARE_PROVIDER_SITE_OTHER): Payer: 59

## 2014-05-22 DIAGNOSIS — M2012 Hallux valgus (acquired), left foot: Secondary | ICD-10-CM

## 2014-05-22 DIAGNOSIS — Z9889 Other specified postprocedural states: Secondary | ICD-10-CM

## 2014-05-22 DIAGNOSIS — M2042 Other hammer toe(s) (acquired), left foot: Secondary | ICD-10-CM

## 2014-05-22 NOTE — Progress Notes (Signed)
Subjective:     Patient ID: Holly Lynch, female   DOB: 07/07/1985, 29 y.o.   MRN: [id number]  HPI patient states I'm doing well with my left foot but I have been on it quite a bit and I've had some swelling if him on it too long   Review of Systems     Objective:   Physical Exam One week after having multiple forefoot surgery left with wound edges well coapted first and fifth metatarsal and stitches intact fourth toe with good alignment of the toe noted    Assessment:     Doing well after having structural forefoot procedures left with mild edema no erythema no drainage noted and wound edges well coapted with negative Homans sign noted    Plan:     Doing well with surgery and advised this patient on elevation and continued immobilization reviewed x-rays and discussed movement around fifth metatarsal but that it should heal uneventfully at this position but we'll have to be watched

## 2014-06-05 ENCOUNTER — Ambulatory Visit: Payer: Self-pay

## 2014-06-05 ENCOUNTER — Ambulatory Visit (INDEPENDENT_AMBULATORY_CARE_PROVIDER_SITE_OTHER): Payer: 59

## 2014-06-05 ENCOUNTER — Ambulatory Visit (INDEPENDENT_AMBULATORY_CARE_PROVIDER_SITE_OTHER): Payer: 59 | Admitting: Podiatry

## 2014-06-05 VITALS — BP 124/74 | HR 75 | Resp 15

## 2014-06-05 DIAGNOSIS — Z9889 Other specified postprocedural states: Secondary | ICD-10-CM

## 2014-06-05 DIAGNOSIS — M2012 Hallux valgus (acquired), left foot: Secondary | ICD-10-CM

## 2014-06-05 DIAGNOSIS — M2042 Other hammer toe(s) (acquired), left foot: Secondary | ICD-10-CM

## 2014-06-05 NOTE — Progress Notes (Signed)
Subjective:     Patient ID: Holly Lynch, female   DOB: 1985-09-06, 29 y.o.   MRN: [id number]  HPI patient presents admitting she's been very active on her left foot and that she's doing well but can get some swelling at the end of the day   Review of Systems     Objective:   Physical Exam Neurovascular status intact muscle strength adequate with well-healing surgical site left first and fifth MPJ and also fourth toe with good range of motion first MPJ and no crepitus within the joint    Assessment:     Doing well post forefoot reconstruction left    Plan:     H&P and x-rays reviewed with patient. I went ahead today and I instructed her that there has been some slight movement of the first metatarsal with a crack into the joint but I do not believe it'll be a problem but I do want her to continue to be careful with her foot and not be overly aggressive. We may have to write her for more pain medication and at this time her to hold off as she has some but she may require more due to the pain and swelling of her foot

## 2014-06-26 ENCOUNTER — Encounter: Payer: Self-pay | Admitting: Podiatry

## 2014-06-26 ENCOUNTER — Ambulatory Visit (INDEPENDENT_AMBULATORY_CARE_PROVIDER_SITE_OTHER): Payer: 59

## 2014-06-26 ENCOUNTER — Ambulatory Visit (INDEPENDENT_AMBULATORY_CARE_PROVIDER_SITE_OTHER): Payer: 59 | Admitting: Podiatry

## 2014-06-26 VITALS — BP 128/70 | HR 114 | Resp 16

## 2014-06-26 DIAGNOSIS — Z9889 Other specified postprocedural states: Secondary | ICD-10-CM

## 2014-06-26 DIAGNOSIS — M2012 Hallux valgus (acquired), left foot: Secondary | ICD-10-CM

## 2014-06-26 DIAGNOSIS — M21612 Bunion of left foot: Secondary | ICD-10-CM

## 2014-06-26 DIAGNOSIS — M2042 Other hammer toe(s) (acquired), left foot: Secondary | ICD-10-CM

## 2014-06-27 ENCOUNTER — Other Ambulatory Visit: Payer: Self-pay | Admitting: Obstetrics and Gynecology

## 2014-06-27 NOTE — Progress Notes (Signed)
Subjective:     Patient ID: Holly Lynch, female   DOB: 04-20-1985, 29 y.o.   MRN: [id number]  HPI patient states I'm doing fine with swelling if I been on my foot for to long but I did go to [place] and was able to get around without much discomfort   Review of Systems     Objective:   Physical Exam Neurovascular status intact with well-healed surgical sites left first and fifth metatarsal left fourth toe with mild to moderate edema around the left fifth MPJ that's localized in nature    Assessment:     Inflammatory condition with slight movement around the fifth metatarsal secondary to foot surgery performed with incision sites are healing well with wound edges well coapted    Plan:     Reviewed x-rays with patient and explained that healing is occurring and at this point I do think that it will heal uneventfully long-term. Swelling is normal and should reduce gradually over the next several months and at this point she can turn to relatively normal activities and hopeful return to work in the next week. Reappoint in the next 4-6 weeks or earlier if any issues should occur ago and

## 2014-07-03 ENCOUNTER — Other Ambulatory Visit: Payer: 59

## 2014-07-07 ENCOUNTER — Other Ambulatory Visit: Payer: 59

## 2014-07-14 ENCOUNTER — Ambulatory Visit (INDEPENDENT_AMBULATORY_CARE_PROVIDER_SITE_OTHER): Payer: 59 | Admitting: Podiatry

## 2014-07-14 ENCOUNTER — Encounter: Payer: Self-pay | Admitting: Podiatry

## 2014-07-14 ENCOUNTER — Ambulatory Visit: Payer: Self-pay

## 2014-07-14 VITALS — BP 131/86 | HR 92 | Resp 15

## 2014-07-14 DIAGNOSIS — M779 Enthesopathy, unspecified: Secondary | ICD-10-CM | POA: Diagnosis not present

## 2014-07-14 DIAGNOSIS — Z9889 Other specified postprocedural states: Secondary | ICD-10-CM

## 2014-07-14 MED ORDER — TRIAMCINOLONE ACETONIDE 10 MG/ML IJ SUSP
10.0000 mg | Freq: Once | INTRAMUSCULAR | Status: AC
  Administered 2014-07-14: 10 mg

## 2014-07-15 NOTE — Progress Notes (Signed)
Subjective:     Patient ID: Holly Lynch, female   DOB: 04/22/1985, 29 y.o.   MRN: [id number]  HPI patient presents stating my foot still doing well but about a week ago I hurt my left forefoot and it's been inflamed and I wanted to get it checked   Review of Systems     Objective:   Physical Exam Neurovascular status intact muscle strength adequate with inflammation second metatarsophalangeal joint of the left foot with fluid buildup within the joint    Assessment:     Probable capsulitis left second MPJ secondary to trauma with foot surgery that's doing very well with good alignment noted    Plan:     Reviewed condition and recommended just a very careful injection of the joint explaining obligations associated with this. Patient wants procedure and I infiltrated with 60 Milligan times like Marcaine mixture prior and proximal to it and then went ahead and aspirated the joint getting out a small amount of clear fluid and injected with a quarter cc dexamethasone Kenalog and applied thick plantar take pressure off the joint. Also scanned for new orthotics to keep pressure off her feet and reappoint when they are ready

## 2014-07-19 ENCOUNTER — Telehealth: Payer: Self-pay | Admitting: *Deleted

## 2014-07-19 NOTE — Telephone Encounter (Addendum)
Pt states her foot hurts as bad as it did before Friday's procedure to drain the area.  Dr. Charlsie Merlesegal ordered stiff bottomed shoe and ice, and Vicodin 5/325 # 20 one every 6 hours prn pain.  I informed pt of Dr. Beverlee Nimsegal's orders, pt agreed, but refused the Vicodin.

## 2014-07-26 ENCOUNTER — Encounter: Payer: 59 | Admitting: Podiatry

## 2014-07-28 ENCOUNTER — Encounter: Payer: 59 | Admitting: Podiatry

## 2014-07-31 ENCOUNTER — Ambulatory Visit: Payer: 59 | Attending: Gynecologic Oncology | Admitting: Gynecologic Oncology

## 2014-07-31 ENCOUNTER — Encounter: Payer: Self-pay | Admitting: Gynecologic Oncology

## 2014-07-31 VITALS — BP 125/72 | HR 90 | Temp 98.4°F | Resp 18 | Ht 67.0 in | Wt 191.9 lb

## 2014-07-31 DIAGNOSIS — L859 Epidermal thickening, unspecified: Secondary | ICD-10-CM | POA: Diagnosis not present

## 2014-07-31 DIAGNOSIS — N904 Leukoplakia of vulva: Secondary | ICD-10-CM | POA: Diagnosis not present

## 2014-07-31 NOTE — Progress Notes (Signed)
Consult Note: Gyn-Onc  Consult was requested by Dr. Dion BodyVarnado for the evaluation of Holly Lynch 29 y.o. female  CC:  Chief Complaint  Patient presents with  . labial mass    Assessment/Plan:  Ms. Holly Lynch  is a 29 y.o.  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 Symptomatic. Benign biopsy on 06/27/14.  I discussed we could trial topical steroids. She declined this. We will attempt excisional biopsy of the right anterior vulva. I discussed risks including clitoral dysfunction and urethral stricture.  I discussed perioperative care and restrictions. She is scheduled for wide local excision of the anterior vulva on 08/14/14.  HPI: Holly Lynch is a 29 year old woman who is seen in consultation at the request of Dr Dion BodyVarnado for hyperkeratosis of the right anterior vulva.  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 She had a verrucous lesion biopsied in 2015 which was positive for condyloma. In October 2015 she underwent laser of the anterior labia. After she healed from the laser she noted a white area on the right anterior labia close the the clitoris. It makes masturbation painful. She has noted it to be increasing in size.  On 06/27/14 Dr Dion BodyVarnado performed a biopsy of the lesion which revealed only hyperkeratosis. No dysplasia was seen.     Current Meds:  Outpatient Encounter Prescriptions as of 07/31/2014  Medication Sig  . aspirin-acetaminophen-caffeine (EXCEDRIN MIGRAINE) 250-250-65 MG per tablet Take 1 tablet by mouth every 6 (six) hours as needed for headache or migraine.  . citalopram (CELEXA) 40 MG tablet Take 40 mg by mouth daily.  Marland Kitchen. HYDROcodone-acetaminophen (NORCO/VICODIN) 5-325 MG per tablet Take 1 tablet by mouth every 6 (six) hours as needed for moderate pain (for cluster headaches).  Marland Kitchen. ibuprofen (ADVIL,MOTRIN) 600 MG tablet Take 1 tablet (600 mg total) by mouth every 6 (six) hours as needed for headache or  mild pain.  Marland Kitchen. lisdexamfetamine (VYVANSE) 70 MG capsule Take 70 mg by mouth every morning.  . Multiple Vitamins-Minerals (HM MULTIVITAMIN ADULT GUMMY) CHEW Chew 1 tablet by mouth daily.  . norethindrone-ethinyl estradiol 1/35 (ORTHO-NOVUM, NORTREL,CYCLAFEM) tablet Take 1 tablet by mouth daily.  . pantoprazole (PROTONIX) 40 MG tablet Take 40 mg by mouth daily.  . ranitidine (ZANTAC) 300 MG tablet Take 300 mg by mouth at bedtime.  . SUMAtriptan (IMITREX) 25 MG tablet Take 25 mg by mouth once as needed for migraine. May repeat in 2 hours if headache persists or recurs.  . traZODone (DESYREL) 50 MG tablet Take 50 mg by mouth at bedtime.  . valACYclovir (VALTREX) 500 MG tablet Take 500 mg by mouth daily.  . meperidine (DEMEROL) 50 MG tablet Take 1 tablet (50 mg total) by mouth every 4 (four) hours as needed for severe pain. (Patient not taking: Reported on 07/31/2014)   No facility-administered encounter medications on file as of 07/31/2014.    Allergy: No Known Allergies  Social Hx:   History   Social History  . Marital Status: Single    Spouse Name: N/A  . Number of Children: N/A  . Years of Education: N/A   Occupational History  . Not on file.   Social History Main Topics  . Smoking status: Never Smoker   . Smokeless tobacco: Not on file  . Alcohol Use: 1.0 oz/week    2 Standard drinks or equivalent per week     Comment: less than 2 drinks a day; social   .  Drug Use: No  . Sexual Activity: Not on file   Other Topics Concern  . Not on file   Social History Narrative    Past Surgical Hx:  Past Surgical History  Procedure Laterality Date  . Tonsillectomy    . Tooth extraction    . Colonoscopy    . Other surgical history      endoscopy 2009  . Metatarsal osteotomy with bunionectomy    . Laser ablation condolamata N/A 09/28/2013    Procedure: CO2 LASER ABLATION CONDYLOMA;  Surgeon: Geryl Rankins, MD;  Location: [facility];  Service: Gynecology;  Laterality: N/A;  . Esophageal  biopsy N/A 09/28/2013    Procedure: VULVAR BIOPSY;  Surgeon: Geryl Rankins, MD;  Location: [facility];  Service: Gynecology;  Laterality: N/A;    Past Medical Hx:  Past Medical History  Diagnosis Date  . Insomnia   . ADD (attention deficit disorder)   . Anxiety   . Migraine   . ADD (attention deficit disorder)   . GERD (gastroesophageal reflux disease)     grade B esophagitis    Past Gynecological History:  HSV and papilloma/condyloma, s/p laser of vulva. No LMP recorded.  Family Hx: History reviewed. No pertinent family history.  Review of Systems:  Constitutional  Feels well,   ENT Normal appearing ears and nares bilaterally Skin/Breast  No rash, sores, jaundice, itching, dryness Cardiovascular  No chest pain, shortness of breath, or edema  Pulmonary  No cough or wheeze.  Gastro Intestinal  No nausea, vomitting, or diarrhoea. No bright red blood per rectum, no abdominal pain, change in bowel movement, or constipation.  Genito Urinary  No frequency, urgency, dysuria, see HPI Musculo Skeletal  No myalgia, arthralgia, joint swelling or pain  Neurologic  No weakness, numbness, change in gait,  Psychology  No depression, anxiety, insomnia.   Vitals:  Blood pressure 125/72, pulse 90, temperature 98.4 F (36.9 C), temperature source Oral, resp. rate 18, height  (1.702 m), weight 191 lb 14.4 oz (87.045 kg), SpO2 100 %.  Physical Exam: WD in NAD Neck  Supple NROM, without any enlargements.  Lymph Node Survey No cervical supraclavicular or inguinal adenopathy Cardiovascular  Pulse normal rate, regularity and rhythm. S1 and S2 normal.  Lungs  Clear to auscultation bilateraly, without wheezes/crackles/rhonchi. Good air movement.  Skin  No rash/lesions/breakdown  Psychiatry  Alert and oriented to person, place, and time  Abdomen  Normoactive bowel sounds, abdomen soft, non-tender and obese without evidence of hernia.  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 Healing biospy site within it. Rectal  deferred Extremities  No bilateral cyanosis, clubbing or edema.   [name], MD   07/31/2014, 1:34 PM

## 2014-07-31 NOTE — Patient Instructions (Addendum)
You will be having a wide local excision on July 25 at 8:[facility].  You will get a call from the surgery center 1-2 days before this date regarding pre-op instructions.  No food or drink after midnight the night before your procedure.

## 2014-08-02 ENCOUNTER — Ambulatory Visit (INDEPENDENT_AMBULATORY_CARE_PROVIDER_SITE_OTHER): Payer: 59 | Admitting: Podiatry

## 2014-08-02 ENCOUNTER — Encounter: Payer: Self-pay | Admitting: Podiatry

## 2014-08-02 ENCOUNTER — Encounter: Payer: 59 | Admitting: Podiatry

## 2014-08-02 VITALS — BP 113/59 | HR 80 | Resp 15

## 2014-08-02 DIAGNOSIS — Z9889 Other specified postprocedural states: Secondary | ICD-10-CM

## 2014-08-02 DIAGNOSIS — M779 Enthesopathy, unspecified: Secondary | ICD-10-CM

## 2014-08-02 NOTE — Patient Instructions (Signed)

## 2014-08-02 NOTE — Progress Notes (Signed)
Subjective:     Patient ID: Holly Lynch, female   DOB: 1985-07-03, 29 y.o.   MRN: [id number]  HPI patient states the pain has improved to a degree but I still have quite a bit of forefoot pain in the boot really seems to help me   Review of Systems     Objective:   Physical Exam Neurovascular status intact muscle strength adequate range of motion within normal limits with patient having mild to moderate discomfort around the second and third MPJs left and well-healed surgical site around the left and fifth metatarsal with good range of motion of the first MPJ with no crepitus noted    Assessment:     Doing well post surgery with inflammatory capsulitis lesser MPJ secondary to excessive work hours    Plan:     H&P and condition reviewed with patient. Today I went ahead and dispensed orthotics with instructions on usage reviewed padding and shoe gear options. Patient will be seen back for us to recheck

## 2014-08-03 ENCOUNTER — Encounter: Payer: 59 | Admitting: Podiatry

## 2014-08-10 ENCOUNTER — Encounter (HOSPITAL_BASED_OUTPATIENT_CLINIC_OR_DEPARTMENT_OTHER): Payer: Self-pay | Admitting: *Deleted

## 2014-08-10 NOTE — Progress Notes (Signed)
NPO AFTER MN.  ARRIVE AT 0630.  NEEDS HG AND URINE PREG. WILL TAKE AM MEDS WITH EXCEPTION NO VYVANCE W/ SIPS OF WATER AM DOS.

## 2014-08-14 ENCOUNTER — Ambulatory Visit (HOSPITAL_BASED_OUTPATIENT_CLINIC_OR_DEPARTMENT_OTHER)
Admission: RE | Admit: 2014-08-14 | Discharge: 2014-08-14 | Disposition: A | Discharge status: Home / Self Care | Payer: 59 | Source: Ambulatory Visit | Attending: Gynecologic Oncology | Admitting: Gynecologic Oncology

## 2014-08-14 ENCOUNTER — Ambulatory Visit (HOSPITAL_BASED_OUTPATIENT_CLINIC_OR_DEPARTMENT_OTHER): Payer: 59 | Admitting: Anesthesiology

## 2014-08-14 ENCOUNTER — Encounter (HOSPITAL_BASED_OUTPATIENT_CLINIC_OR_DEPARTMENT_OTHER): Payer: Self-pay

## 2014-08-14 ENCOUNTER — Encounter (HOSPITAL_BASED_OUTPATIENT_CLINIC_OR_DEPARTMENT_OTHER)
Admission: RE | Disposition: A | Discharge status: Home / Self Care | Payer: Self-pay | Source: Ambulatory Visit | Attending: Gynecologic Oncology

## 2014-08-14 DIAGNOSIS — Z7982 Long term (current) use of aspirin: Secondary | ICD-10-CM | POA: Insufficient documentation

## 2014-08-14 DIAGNOSIS — A630 Anogenital (venereal) warts: Secondary | ICD-10-CM | POA: Diagnosis not present

## 2014-08-14 DIAGNOSIS — K219 Gastro-esophageal reflux disease without esophagitis: Secondary | ICD-10-CM | POA: Diagnosis not present

## 2014-08-14 DIAGNOSIS — F419 Anxiety disorder, unspecified: Secondary | ICD-10-CM | POA: Insufficient documentation

## 2014-08-14 DIAGNOSIS — L928 Other granulomatous disorders of the skin and subcutaneous tissue: Secondary | ICD-10-CM

## 2014-08-14 DIAGNOSIS — Z3200 Encounter for pregnancy test, result unknown: Secondary | ICD-10-CM | POA: Insufficient documentation

## 2014-08-14 DIAGNOSIS — Z79899 Other long term (current) drug therapy: Secondary | ICD-10-CM | POA: Diagnosis not present

## 2014-08-14 DIAGNOSIS — R229 Localized swelling, mass and lump, unspecified: Secondary | ICD-10-CM | POA: Diagnosis present

## 2014-08-14 DIAGNOSIS — D071 Carcinoma in situ of vulva: Secondary | ICD-10-CM | POA: Diagnosis not present

## 2014-08-14 DIAGNOSIS — L859 Epidermal thickening, unspecified: Secondary | ICD-10-CM | POA: Diagnosis present

## 2014-08-14 HISTORY — DX: Epidermal thickening, unspecified: L85.9

## 2014-08-14 HISTORY — DX: Other specified noninflammatory disorders of vulva and perineum: N90.89

## 2014-08-14 HISTORY — DX: Frequency of micturition: R35.0

## 2014-08-14 HISTORY — DX: Personal history of other diseases of the digestive system: Z87.19

## 2014-08-14 HISTORY — DX: Presence of spectacles and contact lenses: Z97.3

## 2014-08-14 HISTORY — DX: Cluster headache syndrome, unspecified, not intractable: G44.009

## 2014-08-14 HISTORY — PX: VULVECTOMY: SHX1086

## 2014-08-14 HISTORY — DX: Personal history of other infectious and parasitic diseases: Z86.19

## 2014-08-14 HISTORY — DX: Herpesviral infection of urogenital system, unspecified: A60.00

## 2014-08-14 LAB — POCT PREGNANCY, URINE: Preg Test, Ur: NEGATIVE

## 2014-08-14 SURGERY — WIDE EXCISION VULVECTOMY
Anesthesia: Monitor Anesthesia Care | Site: Vulva

## 2014-08-14 MED ORDER — FENTANYL CITRATE (PF) 100 MCG/2ML IJ SOLN
INTRAMUSCULAR | Status: AC
  Filled 2014-08-14: qty 4

## 2014-08-14 MED ORDER — ONDANSETRON HCL 4 MG/2ML IJ SOLN
INTRAMUSCULAR | Status: DC | PRN
  Administered 2014-08-14: 4 mg via INTRAVENOUS

## 2014-08-14 MED ORDER — PROMETHAZINE HCL 25 MG PO TABS
ORAL_TABLET | ORAL | Status: AC
  Filled 2014-08-14: qty 1

## 2014-08-14 MED ORDER — HYDROCODONE-ACETAMINOPHEN 5-325 MG PO TABS
1.0000 | ORAL_TABLET | Freq: Four times a day (QID) | ORAL | Status: DC | PRN
  Administered 2014-08-14: 1 via ORAL
  Filled 2014-08-14: qty 2

## 2014-08-14 MED ORDER — MEPERIDINE HCL 25 MG/ML IJ SOLN
6.2500 mg | INTRAMUSCULAR | Status: DC | PRN
  Filled 2014-08-14: qty 1

## 2014-08-14 MED ORDER — FENTANYL CITRATE (PF) 100 MCG/2ML IJ SOLN
INTRAMUSCULAR | Status: DC | PRN
  Administered 2014-08-14 (×2): 50 ug via INTRAVENOUS

## 2014-08-14 MED ORDER — PROPOFOL 10 MG/ML IV BOLUS
INTRAVENOUS | Status: DC | PRN
  Administered 2014-08-14: 200 mg via INTRAVENOUS

## 2014-08-14 MED ORDER — LIDOCAINE HCL 1 % IJ SOLN
INTRAMUSCULAR | Status: DC | PRN
  Administered 2014-08-14: 8 mL

## 2014-08-14 MED ORDER — FENTANYL CITRATE (PF) 100 MCG/2ML IJ SOLN
25.0000 ug | INTRAMUSCULAR | Status: DC | PRN
  Filled 2014-08-14: qty 1

## 2014-08-14 MED ORDER — ACETAMINOPHEN 10 MG/ML IV SOLN
INTRAVENOUS | Status: DC | PRN
Start: 2014-08-14 — End: 2014-08-14
  Administered 2014-08-14: 1000 mg via INTRAVENOUS

## 2014-08-14 MED ORDER — HYDROCODONE-ACETAMINOPHEN 5-325 MG PO TABS
ORAL_TABLET | ORAL | Status: AC
  Filled 2014-08-14: qty 1

## 2014-08-14 MED ORDER — MEPERIDINE HCL 50 MG PO TABS: 50.0000 mg | ORAL_TABLET | ORAL | Status: DC | PRN

## 2014-08-14 MED ORDER — IBUPROFEN 800 MG PO TABS: 800.0000 mg | ORAL_TABLET | Freq: Three times a day (TID) | ORAL | Status: AC | PRN

## 2014-08-14 MED ORDER — LIDOCAINE HCL (CARDIAC) 20 MG/ML IV SOLN
INTRAVENOUS | Status: DC | PRN
  Administered 2014-08-14: 80 mg via INTRAVENOUS

## 2014-08-14 MED ORDER — PROMETHAZINE HCL 12.5 MG PO TABS: 12.5000 mg | ORAL_TABLET | Freq: Four times a day (QID) | ORAL | Status: DC | PRN

## 2014-08-14 MED ORDER — LACTATED RINGERS IV SOLN
INTRAVENOUS | Status: DC
  Administered 2014-08-14: 07:00:00 via INTRAVENOUS
  Filled 2014-08-14: qty 1000

## 2014-08-14 MED ORDER — PROMETHAZINE HCL 25 MG/ML IJ SOLN
6.2500 mg | INTRAMUSCULAR | Status: DC | PRN
  Filled 2014-08-14: qty 1

## 2014-08-14 MED ORDER — MIDAZOLAM HCL 5 MG/5ML IJ SOLN
INTRAMUSCULAR | Status: DC | PRN
  Administered 2014-08-14: 2 mg via INTRAVENOUS

## 2014-08-14 MED ORDER — MIDAZOLAM HCL 2 MG/2ML IJ SOLN
INTRAMUSCULAR | Status: AC
  Filled 2014-08-14: qty 2

## 2014-08-14 MED ORDER — DEXAMETHASONE SODIUM PHOSPHATE 4 MG/ML IJ SOLN
INTRAMUSCULAR | Status: DC | PRN
  Administered 2014-08-14: 10 mg via INTRAVENOUS

## 2014-08-14 MED ORDER — ACETIC ACID 5 % SOLN
Status: DC | PRN
  Administered 2014-08-14: 1 via TOPICAL

## 2014-08-14 MED ORDER — LACTATED RINGERS IV SOLN
INTRAVENOUS | Status: DC
  Filled 2014-08-14: qty 1000

## 2014-08-14 SURGICAL SUPPLY — 45 items
APPLICATOR COTTON TIP 6IN STRL (MISCELLANEOUS) IMPLANT
BLADE CLIPPER SURG (BLADE) IMPLANT
BLADE SURG 15 STRL LF DISP TIS (BLADE) ×1 IMPLANT
BLADE SURG 15 STRL SS (BLADE) ×1
BNDG GAUZE ELAST 4 BULKY (GAUZE/BANDAGES/DRESSINGS) ×2 IMPLANT
BRIEF STRETCH FOR OB PAD LRG (UNDERPADS AND DIAPERS) ×2 IMPLANT
CANISTER SUCTION 2500CC (MISCELLANEOUS) ×2 IMPLANT
CATH FOLEY 2WAY SLVR  5CC 14FR (CATHETERS)
CATH FOLEY 2WAY SLVR 5CC 14FR (CATHETERS) IMPLANT
CATH ROBINSON RED A/P 14FR (CATHETERS) IMPLANT
COVER BACK TABLE 60X90IN (DRAPES) ×2 IMPLANT
DRAPE LG THREE QUARTER DISP (DRAPES) ×2 IMPLANT
DRAPE UNDERBUTTOCKS STRL (DRAPE) ×2 IMPLANT
GAUZE SPONGE 4X4 12PLY STRL (GAUZE/BANDAGES/DRESSINGS) IMPLANT
GAUZE SPONGE 4X4 16PLY XRAY LF (GAUZE/BANDAGES/DRESSINGS) IMPLANT
GLOVE BIO SURGEON STRL SZ 6 (GLOVE) ×4 IMPLANT
LEGGING LITHOTOMY PAIR STRL (DRAPES) ×2 IMPLANT
MANIFOLD NEPTUNE II (INSTRUMENTS) IMPLANT
NEEDLE HYPO 22GX1.5 SAFETY (NEEDLE) IMPLANT
NEEDLE HYPO 25X1 1.5 SAFETY (NEEDLE) ×2 IMPLANT
NS IRRIG 500ML POUR BTL (IV SOLUTION) ×2 IMPLANT
PACK BASIN DAY SURGERY FS (CUSTOM PROCEDURE TRAY) ×2 IMPLANT
PAD OB MATERNITY 4.3X12.25 (PERSONAL CARE ITEMS) IMPLANT
PENCIL BUTTON HOLSTER BLD 10FT (ELECTRODE) ×2 IMPLANT
SCOPETTES 8  STERILE (MISCELLANEOUS)
SCOPETTES 8 STERILE (MISCELLANEOUS) IMPLANT
SUT VIC AB 0 SH 27 (SUTURE) ×2 IMPLANT
SUT VIC AB 2-0 CT2 27 (SUTURE) IMPLANT
SUT VIC AB 2-0 SH 27 (SUTURE)
SUT VIC AB 2-0 SH 27X BRD (SUTURE) IMPLANT
SUT VIC AB 3-0 PS2 18 (SUTURE) ×2
SUT VIC AB 3-0 PS2 18XBRD (SUTURE) ×2 IMPLANT
SUT VIC AB 3-0 SH 27 (SUTURE)
SUT VIC AB 3-0 SH 27X BRD (SUTURE) IMPLANT
SUT VICRYL 2 0 18  UND BR (SUTURE)
SUT VICRYL 2 0 18 UND BR (SUTURE) IMPLANT
SUT VICRYL 4-0 PS2 18IN ABS (SUTURE) ×4 IMPLANT
SYR BULB IRRIGATION 50ML (SYRINGE) ×2 IMPLANT
SYRINGE CONTROL L 12CC (SYRINGE) ×2 IMPLANT
TOWEL OR 17X24 6PK STRL BLUE (TOWEL DISPOSABLE) ×2 IMPLANT
TRAY DSU PREP LF (CUSTOM PROCEDURE TRAY) ×2 IMPLANT
TUBE CONNECTING 12X1/4 (SUCTIONS) ×2 IMPLANT
UNDERPAD 30X30 INCONTINENT (UNDERPADS AND DIAPERS) ×2 IMPLANT
WATER STERILE IRR 500ML POUR (IV SOLUTION) ×2 IMPLANT
YANKAUER SUCT BULB TIP NO VENT (SUCTIONS) ×2 IMPLANT

## 2014-08-14 NOTE — Anesthesia Preprocedure Evaluation (Signed)
Anesthesia Evaluation  Patient identified by MRN, date of birth, ID band Patient awake    Reviewed: Allergy & Precautions, NPO status , Patient's Chart, lab work & pertinent test results  Airway Mallampati: II  TM Distance: >3 FB Neck ROM: Full    Dental no notable dental hx.    Pulmonary neg pulmonary ROS,  breath sounds clear to auscultation  Pulmonary exam normal       Cardiovascular negative cardio ROS Normal cardiovascular examRhythm:Regular Rate:Normal     Neuro/Psych negative neurological ROS  negative psych ROS   GI/Hepatic negative GI ROS, Neg liver ROS,   Endo/Other  negative endocrine ROS  Renal/GU negative Renal ROS  negative genitourinary   Musculoskeletal negative musculoskeletal ROS (+)   Abdominal   Peds negative pediatric ROS (+)  Hematology negative hematology ROS (+)   Anesthesia Other Findings   Reproductive/Obstetrics negative OB ROS                             Anesthesia Physical Anesthesia Plan  ASA: I  Anesthesia Plan: General and MAC   Post-op Pain Management:    Induction: Intravenous  Airway Management Planned: LMA and Simple Face Mask  Additional Equipment:   Intra-op Plan:   Post-operative Plan:   Informed Consent: I have reviewed the patients History and Physical, chart, labs and discussed the procedure including the risks, benefits and alternatives for the proposed anesthesia with the patient or authorized representative who has indicated his/her understanding and acceptance.   Dental advisory given  Plan Discussed with: CRNA  Anesthesia Plan Comments:         Anesthesia Quick Evaluation

## 2014-08-14 NOTE — Anesthesia Postprocedure Evaluation (Signed)
  Anesthesia Post-op Note  Patient: Holly Lynch  Procedure(s) Performed: Procedure(s) (LRB): WIDE LOCAL EXCISION VULVA (N/A)  Patient Location: PACU  Anesthesia Type: General  Level of Consciousness: awake and alert   Airway and Oxygen Therapy: Patient Spontanous Breathing  Post-op Pain: mild  Post-op Assessment: Post-op Vital signs reviewed, Patient's Cardiovascular Status Stable, Respiratory Function Stable, Patent Airway and No signs of Nausea or vomiting  Last Vitals:  Filed Vitals:   08/14/14 0930  BP: 96/67  Pulse: 67  Temp:   Resp: 17    Post-op Vital Signs: stable   Complications: No apparent anesthesia complications

## 2014-08-14 NOTE — H&P (View-Only) (Signed)
Consult Note: Gyn-Onc  Consult was requested by Dr. Dion BodyVarnado for the evaluation of Holly Lynch 29 y.o. female  CC:  Chief Complaint  Patient presents with  . labial mass    Assessment/Plan:  Holly Lynch  is a 29 y.o.  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 Symptomatic. Benign biopsy on 06/27/14.  I discussed we could trial topical steroids. She declined this. We will attempt excisional biopsy of the right anterior vulva. I discussed risks including clitoral dysfunction and urethral stricture.  I discussed perioperative care and restrictions. She is scheduled for wide local excision of the anterior vulva on 08/14/14.  HPI: Holly Lynch is a 29 year old woman who is seen in consultation at the request of Dr Dion BodyVarnado for hyperkeratosis of the right anterior vulva.  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 She had a verrucous lesion biopsied in 2015 which was positive for condyloma. In October 2015 she underwent laser of the anterior labia. After she healed from the laser she noted a white area on the right anterior labia close the the clitoris. It makes masturbation painful. She has noted it to be increasing in size.  On 06/27/14 Dr Dion BodyVarnado performed a biopsy of the lesion which revealed only hyperkeratosis. No dysplasia was seen.     Current Meds:  Outpatient Encounter Prescriptions as of 07/31/2014  Medication Sig  . aspirin-acetaminophen-caffeine (EXCEDRIN MIGRAINE) 250-250-65 MG per tablet Take 1 tablet by mouth every 6 (six) hours as needed for headache or migraine.  . citalopram (CELEXA) 40 MG tablet Take 40 mg by mouth daily.  Marland Kitchen. HYDROcodone-acetaminophen (NORCO/VICODIN) 5-325 MG per tablet Take 1 tablet by mouth every 6 (six) hours as needed for moderate pain (for cluster headaches).  Marland Kitchen. ibuprofen (ADVIL,MOTRIN) 600 MG tablet Take 1 tablet (600 mg total) by mouth every 6 (six) hours as needed for headache or  mild pain.  Marland Kitchen. lisdexamfetamine (VYVANSE) 70 MG capsule Take 70 mg by mouth every morning.  . Multiple Vitamins-Minerals (HM MULTIVITAMIN ADULT GUMMY) CHEW Chew 1 tablet by mouth daily.  . norethindrone-ethinyl estradiol 1/35 (ORTHO-NOVUM, NORTREL,CYCLAFEM) tablet Take 1 tablet by mouth daily.  . pantoprazole (PROTONIX) 40 MG tablet Take 40 mg by mouth daily.  . ranitidine (ZANTAC) 300 MG tablet Take 300 mg by mouth at bedtime.  . SUMAtriptan (IMITREX) 25 MG tablet Take 25 mg by mouth once as needed for migraine. May repeat in 2 hours if headache persists or recurs.  . traZODone (DESYREL) 50 MG tablet Take 50 mg by mouth at bedtime.  . valACYclovir (VALTREX) 500 MG tablet Take 500 mg by mouth daily.  . meperidine (DEMEROL) 50 MG tablet Take 1 tablet (50 mg total) by mouth every 4 (four) hours as needed for severe pain. (Patient not taking: Reported on 07/31/2014)   No facility-administered encounter medications on file as of 07/31/2014.    Allergy: No Known Allergies  Social Hx:   History   Social History  . Marital Status: Single    Spouse Name: N/A  . Number of Children: N/A  . Years of Education: N/A   Occupational History  . Not on file.   Social History Main Topics  . Smoking status: Never Smoker   . Smokeless tobacco: Not on file  . Alcohol Use: 1.0 oz/week    2 Standard drinks or equivalent per week     Comment: less than 2 drinks a day; social   .  Drug Use: No  . Sexual Activity: Not on file   Other Topics Concern  . Not on file   Social History Narrative    Past Surgical Hx:  Past Surgical History  Procedure Laterality Date  . Tonsillectomy    . Tooth extraction    . Colonoscopy    . Other surgical history      endoscopy 2009  . Metatarsal osteotomy with bunionectomy    . Laser ablation condolamata N/A 09/28/2013    Procedure: CO2 LASER ABLATION CONDYLOMA;  Surgeon: Evelyn Varnado, MD;  Location: [facility];  Service: Gynecology;  Laterality: N/A;  . Esophageal  biopsy N/A 09/28/2013    Procedure: VULVAR BIOPSY;  Surgeon: Evelyn Varnado, MD;  Location: [facility];  Service: Gynecology;  Laterality: N/A;    Past Medical Hx:  Past Medical History  Diagnosis Date  . Insomnia   . ADD (attention deficit disorder)   . Anxiety   . Migraine   . ADD (attention deficit disorder)   . GERD (gastroesophageal reflux disease)     grade B esophagitis    Past Gynecological History:  HSV and papilloma/condyloma, s/p laser of vulva. No LMP recorded.  Family Hx: History reviewed. No pertinent family history.  Review of Systems:  Constitutional  Feels well,   ENT Normal appearing ears and nares bilaterally Skin/Breast  No rash, sores, jaundice, itching, dryness Cardiovascular  No chest pain, shortness of breath, or edema  Pulmonary  No cough or wheeze.  Gastro Intestinal  No nausea, vomitting, or diarrhoea. No bright red blood per rectum, no abdominal pain, change in bowel movement, or constipation.  Genito Urinary  No frequency, urgency, dysuria, see HPI Musculo Skeletal  No myalgia, arthralgia, joint swelling or pain  Neurologic  No weakness, numbness, change in gait,  Psychology  No depression, anxiety, insomnia.   Vitals:  Blood pressure 125/72, pulse 90, temperature 98.4 F (36.9 C), temperature source Oral, resp. rate 18, height 5' 7" (1.702 m), weight 191 lb 14.4 oz (87.045 kg), SpO2 100 %.  Physical Exam: WD in NAD Neck  Supple NROM, without any enlargements.  Lymph Node Survey No cervical supraclavicular or inguinal adenopathy Cardiovascular  Pulse normal rate, regularity and rhythm. S1 and S2 normal.  Lungs  Clear to auscultation bilateraly, without wheezes/crackles/rhonchi. Good air movement.  Skin  No rash/lesions/breakdown  Psychiatry  Alert and oriented to person, place, and time  Abdomen  Normoactive bowel sounds, abdomen soft, non-tender and obese without evidence of hernia.  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 Healing biospy site within it. Rectal  deferred Extremities  No bilateral cyanosis, clubbing or edema.   Ishan Sanroman Caroline, MD   07/31/2014, 1:34 PM      

## 2014-08-14 NOTE — Op Note (Signed)
OPERATIVE NOTE  PATIENT: Holly Lynch DATE: 08/14/14   Preop Diagnosis: persistent granulation tissue of anterior vulva  Postoperative Diagnosis: same  Surgery: Partial simple simple partial anterior vulvectomy  Surgeons:  [name], MD Assistant: none  Anesthesia: General   Estimated blood loss: <10 ml  IVF:    Urine output: 20 ml straight cath  Complications: None   Pathology: medial margin of right anterior labia minora, lateral margin of right anterior labia minora   Operative findings: granular, nodular granulation tissue on anterior right labia minora extending across anterior vulva in between clitoris and urethral meatus (approximating urethral meatus).  Procedure: The patient was identified in the preoperative holding area. Informed consent was signed on the chart. Patient was seen history was reviewed and exam was performed.   The patient was then taken to the operating room and placed in the supine position with SCD hose on. General anesthesia was then induced without difficulty. She was then placed in the dorsolithotomy position. The perineum was prepped with Betadine. The vagina was prepped with Betadine. The patient was then draped after the prep was dried. A Foley catheter was inserted into the bladder under sterile conditions, straight cath'ed and then removed.  Timeout was performed the patient, procedure, antibiotic, allergy, and length of procedure. 5% acetic acid solution was applied to the perineum. The vulvar tissues were inspected for areas of acetowhite changes or leukoplakia. The lesion was identified and the marking pen was used to circumscribe the area with appropriate surgical margins. The subcuticular tissues were infiltrated with 1% lidocaine. The 15 blade scalpel was used to make an incision through the skin circumferentially as marked. The skin elipse was grasped and was separated from the underlying deep dermal tissues with the  bovie device. After the specimen had been completely resected, it fragmented during specimen delivery into two halves (the medial and lateral aspect). The bovie was used to obtain hemostasis at the surgical bed. The subcutaneous tissues were irrigated and made hemostatic.   The deep dermal layer was approximated with 3-0vicryl mattress sutures to bring the skin edges into approximation and off tension. The wound was closed following langher's lines. The cutaneous layer was closed with interrupted 4-0 vicryl stitches and mattress sutures to ensure a tension free and hemostatic closure. The perineum was again irrigated.   All instrument, suture, laparotomy, Ray-Tec, and needle counts were correct x2. The patient tolerated the procedure well and was taken recovery room in stable condition. This is Holly Lynch dictating an operative note on Holly Lynch.

## 2014-08-14 NOTE — Interval H&P Note (Signed)
History and Physical Interval Note:  08/14/2014 7:48 AM  Holly Lynch  has presented today for surgery, with the diagnosis of LABIAL MASS   The various methods of treatment have been discussed with the patient and family. After consideration of risks, benefits and other options for treatment, the patient has consented to  Procedure(s): WIDE LOCAL EXCISION VULVA (N/A) as a surgical intervention .  The patient's history has been reviewed, patient examined, no change in status, stable for surgery.  I have reviewed the patient's chart and labs.  Questions were answered to the patient's satisfaction.     [name]

## 2014-08-14 NOTE — Transfer of Care (Signed)
Immediate Anesthesia Transfer of Care Note  Patient: Holly Lynch  Procedure(s) Performed: Procedure(s): WIDE LOCAL EXCISION VULVA (N/A)  Patient Location: PACU  Anesthesia Type:General  Level of Consciousness: awake, alert  and oriented  Airway & Oxygen Therapy: Patient Spontanous Breathing and Patient connected to nasal cannula oxygen  Post-op Assessment: Report given to RN  Post vital signs: Reviewed and stable  Last Vitals:  Filed Vitals:   08/14/14 0642  BP: 114/67  Pulse: 72  Temp: 36.5 C  Resp: 16    Complications: No apparent anesthesia complications

## 2014-08-14 NOTE — Anesthesia Procedure Notes (Signed)
Procedure Name: LMA Insertion Date/Time: 08/14/2014 8:01 AM Performed by: Norva Pavlov Pre-anesthesia Checklist: Patient identified, Emergency Drugs available, Suction available and Patient being monitored Patient Re-evaluated:Patient Re-evaluated prior to inductionOxygen Delivery Method: Circle System Utilized Preoxygenation: Pre-oxygenation with 100% oxygen Intubation Type: IV induction Ventilation: Mask ventilation without difficulty LMA: LMA inserted LMA Size: 4.0 Number of attempts: 1 Airway Equipment and Method: bite block Placement Confirmation: positive ETCO2 Tube secured with: Tape Dental Injury: Teeth and Oropharynx as per pre-operative assessment

## 2014-08-14 NOTE — Discharge Instructions (Signed)
Vulvectomy, Care After °The vulva is the external female genitalia, outside and around the vagina and pubic bone. It consists of: °· The skin on, and in front of, the pubic bone. °· The clitoris. °· The labia majora (large lips) on the outside of the vagina. °· The labia minora (small lips) around the opening of the vagina. °· The opening and the skin in and around the vagina. °A vulvectomy is the removal of the tissue of the vulva, which sometimes includes removal of the lymph nodes and tissue in the groin areas. °These discharge instructions provide you with general information on caring for yourself after you leave the hospital. It is also important that you know the warning signs of complications, so that you can seek treatment. Please read the instructions outlined below and refer to this sheet in the next few weeks. Your caregiver may also give you specific information and medicines. If you have any questions or complications after discharge, please call your caregiver. °ACTIVITY °· Rest as much as possible the first two weeks after discharge. °· Arrange to have help from family or others with your daily activities when you go home. °· Avoid heavy lifting (more than 5 pounds), pushing, or pulling. °· If you feel tired, balance your activity with rest periods. °· Follow your caregiver's instruction about climbing stairs and driving a car. °· Increase activity gradually. °· Do not exercise until you have permission from your caregiver. °LEG AND FOOT CARE °If your doctor has removed lymph nodes from your groin area, there may be an increase in swelling of your legs and feet. You can help prevent swelling by doing the following: °· Elevate your legs while sitting or lying down. °· If your caregiver has ordered special stockings, wear them according to instructions. °· Avoid standing in one place for long periods of time. °· Call the physical therapy department if you have any questions about swelling or treatment  for swelling. °· Avoid salt in your diet. It can cause fluid retention and swelling. °· Do not cross your legs, especially when sitting. °NUTRITION °· You may resume your normal diet. °· Drink 6 to 8 glasses of fluids a day. °· Eat a healthy, balanced diet including portions of food from the meat (protein), milk, fruit, vegetable, and bread groups. °· Your caregiver may recommend you take a multivitamin with iron. °ELIMINATION °· You may notice that your stream of urine is at a different angle, and may tend to spray. Using a plastic funnel may help to decrease urine spray. °· If constipation occurs, drink more liquids, and add more fruits, vegetables, and bran to your diet. You may take a mild laxative, such as Milk of Magnesia, Metamucil, or a stool softener such as Colace, with permission from your caregiver. °HYGIENE °· You may shower and wash your hair. °· Check with your caregiver about tub baths. °· Do not add any bath oils or chemicals to your bath water, after you have permission to take baths. °· While passing urine, pour water from a bottle or spray over your vulva to dilute the urine as it passes the incision (this will decrease burning and discomfort). °· Clean yourself well after moving your bowels. °· After urinating, do not wipe. Dap or pat dry with toilet paper or a dry cleath soft cloth. °· A sitz bath will help keep your perineal area clean, reduce swelling, and provide comfort. °· Avoid wearing underpants for the first 2 weeks and wear loose skirts to   allow circulation of air around the incision °· You do not need to apply dressings, salves or lotions to the wound. °· The stitches are self-dissolving and will absorb and disappear over a couple of months (it is normal to notice the knot from the stitches on toilet paper after voiding). °HOME CARE INSTRUCTIONS  °· Apply a soft ice pack (or frozen bag of peas) to your perineum (vulva) every hour in the first 48 hours after surgery. This will reduce  swelling. °· Avoid activities that involve a lot of friction between your legs. °· Avoid wearing pants or underpants in the 1st 2 weeks (skirts are preferable). °· Take your temperature twice a day and record it, especially if you feel feverish or have chills. °· Follow your caregiver's instructions about medicines, activity, and follow-up appointments after surgery. °· Do not drink alcohol while taking pain medicine. °· Change your dressing as advised by your caregiver. °· You may take over-the-counter medicine for pain, recommended by your caregiver. °· If your pain is not relieved with medicine, call your caregiver. °· Do not take aspirin because it can cause bleeding. °· Do not douche or use tampons (use a nonperfumed sanitary pad). °· Do not have sexual intercourse until your caregiver gives you permission (typically 6 weeks postoperatively). Hugging, kissing, and playful sexual activity is fine with your caregiver's permission. °· Warm sitz baths, with your caregiver's permission, are helpful to control swelling and discomfort. °· Take showers instead of baths, until your caregiver gives you permission to take baths. °· You may take a mild medicine for constipation, recommended by your caregiver. Bran foods and drinking a lot of fluids will help with constipation. °· Make sure your family understands everything about your operation and recovery. °SEEK MEDICAL CARE IF:  °· You notice swelling and redness around the wound area. °· You notice a foul smell coming from the wound or on the surgical dressing. °· You notice the wound is separating. °· You have painful or bloody urination. °· You develop nausea and vomiting. °· You develop diarrhea. °· You develop a rash. °· You have a reaction or allergy from the medicine. °· You feel dizzy or light-headed. °· You need stronger pain medicine. °SEEK IMMEDIATE MEDICAL CARE IF:  °· You develop a temperature of 102° F (38.9° C) or higher. °· You pass out. °· You develop  leg or chest pain. °· You develop abdominal pain. °· You develop shortness of breath. °· You develop bleeding from the wound area. °· You see pus in the wound area. °MAKE SURE YOU:  °· Understand these instructions. °· Will watch your condition. °· Will get help right away if you are not doing well or get worse. °Document Released: 08/21/2003 Document Revised: 05/23/2013 Document Reviewed: 12/08/2008 °[employer]® Patient Information ©2015 [employer]. This information is not intended to replace advice given to you by your health care provider. Make sure you discuss any questions you have with your health care provider. °Post Anesthesia Home Care Instructions ° °Activity: °Get plenty of rest for the remainder of the day. A responsible adult should stay with you for 24 hours following the procedure.  °For the next 24 hours, DO NOT: °-Drive a car °-Operate machinery °-Drink alcoholic beverages °-Take any medication unless instructed by your physician °-Make any legal decisions or sign important papers. ° °Meals: °Start with liquid foods such as gelatin or soup. Progress to regular foods as tolerated. Avoid greasy, spicy, heavy foods. If nausea and/or vomiting occur, drink   only clear liquids until the nausea and/or vomiting subsides. Call your physician if vomiting continues. ° °Special Instructions/Symptoms: °Your throat may feel dry or sore from the anesthesia or the breathing tube placed in your throat during surgery. If this causes discomfort, gargle with warm salt water. The discomfort should disappear within 24 hours. ° °If you had a scopolamine patch placed behind your ear for the management of post- operative nausea and/or vomiting: ° °1. The medication in the patch is effective for 72 hours, after which it should be removed.  Wrap patch in a tissue and discard in the trash. Wash hands thoroughly with soap and water. °2. You may remove the patch earlier than 72 hours if you experience unpleasant side effects  which may include dry mouth, dizziness or visual disturbances. °3. Avoid touching the patch. Wash your hands with soap and water after contact with the patch. °  ° °

## 2014-08-15 ENCOUNTER — Encounter (HOSPITAL_BASED_OUTPATIENT_CLINIC_OR_DEPARTMENT_OTHER): Payer: Self-pay | Admitting: Gynecologic Oncology

## 2014-08-17 ENCOUNTER — Telehealth: Payer: Self-pay | Admitting: *Deleted

## 2014-08-17 LAB — POCT HEMOGLOBIN-HEMACUE: Hemoglobin: 12.9 g/dL (ref 12.0–15.0)

## 2014-08-17 NOTE — Telephone Encounter (Signed)
Received VM from patient requesting return call in regards to a stitch coming out in the area she recently had surgery. Returned call and notified patient that this is normal. She states when she wipes she had a tiny amount of red blood from where the stitch came out but no other concerns. Told patient if she has any additional concerns prior to her followup appt on 09/08/14 to please call our office. Patient agreeable to this.

## 2014-08-18 ENCOUNTER — Telehealth: Payer: Self-pay | Admitting: Gynecologic Oncology

## 2014-08-18 NOTE — Telephone Encounter (Signed)
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 She requires surveillance with 6 monthly evaluations of the vuvla. [name], MD

## 2014-08-21 ENCOUNTER — Telehealth: Payer: Self-pay | Admitting: *Deleted

## 2014-08-21 NOTE — Telephone Encounter (Signed)
Patient's sister called stating patient needs to be seen sooner than her follow up appointment because she is having issues post-op.  Advised we would need to speak to the patient directly due to HIPPA.  Spoke with the patient and she reports an odor and a yellowish white discharge from her incision close to her clitoris.  Follow up appointment made for Friday.  Patient advised to call if sooner appt needed.  Advised to call for any questions or concerns.

## 2014-08-23 ENCOUNTER — Encounter: Payer: Self-pay | Admitting: Gynecologic Oncology

## 2014-08-23 ENCOUNTER — Ambulatory Visit: Payer: 59 | Attending: Gynecologic Oncology | Admitting: Gynecologic Oncology

## 2014-08-23 VITALS — BP 117/78 | Temp 98.1°F | Resp 16 | Ht 67.0 in | Wt 200.6 lb

## 2014-08-23 DIAGNOSIS — D071 Carcinoma in situ of vulva: Secondary | ICD-10-CM | POA: Insufficient documentation

## 2014-08-23 MED ORDER — MEPERIDINE HCL 50 MG PO TABS: 50.0000 mg | ORAL_TABLET | ORAL | Status: DC | PRN

## 2014-08-23 MED ORDER — ESTROGENS, CONJUGATED 0.625 MG/GM VA CREA: 1.0000 | TOPICAL_CREAM | Freq: Every day | VAGINAL | Status: AC

## 2014-08-23 NOTE — Progress Notes (Signed)
Consult Note: Gyn-Onc  Consult was requested by Dr. Dion Body for the evaluation of Holly Lynch 29 y.o. female  CC:  Chief Complaint  Patient presents with  . VIN III (vulvar intrepithelial neoplasia III)    post-op check    Assessment/Plan:  Holly Lynch  is a 29 y.o.  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 Symptomatic. Benign biopsy on 06/27/14.   Wide local excision that was recently performed revealed VIN 3 with negative margins. The patient has had complete separation of the wound. There is exudate however there is no obvious evidence of any necrotic tissue or infection. I am concerned that she may include an 8 at the level of the incision as the areas to come together when she closes her legs. I've encouraged her to increase her sitz baths to 4 times a week and apply a small amount of estrogen cream to the base of the wound to keep the area from agglutinating together. She was shown the wound with the mirror. She will stay out of work for a total of 3 weeks after the procedural however, she will be able to participate work-related activity on August 10. She will follow-up with Dr. Loree [facility] for evaluation on August 12.  HPI: Holly Lynch is a 29 year old woman who is seen in consultation at the request of Dr Dion Body for hyperkeratosis of the right anterior vulva.  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 She had a verrucous lesion biopsied in 2015 which was positive for condyloma. In October 2015 she underwent laser of the anterior labia. After she healed from the laser she noted a white area on the right anterior labia close the the clitoris. It makes masturbation painful. She has noted it to be increasing in size.  On 06/27/14 Dr Dion Body performed a biopsy of the lesion which revealed only hyperkeratosis. No dysplasia was seen.    After discussion of treatment options with Dr. Andrey Farmer she elected for a wide  local excision on the vulva was performed on July 25. Pathology revealed: Diagnosis 1. Labium, biopsy, Medial margin of right anterior labia minora - CONDYLOMA ACUMINATUM. - RESECTION MARGINS ARE NEGATIVE. 2. 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 - RESECTION MARGINS ARE NEGATIVE.  The patient is called with several issues since her surgery and comes in today for evaluation.  She states that this past Sunday she had similar at the incision and she looked at the wound. She states that looked like "soggy widespread". On Monday shows increasing pain and discharge. She used to diluted hydrogen peroxide solution to irrigate the area without benefit. 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 She had increasing pain yesterday and some bleeding. She felt like the bleeding was similar to a period. She states that the odor and discharge increased and she comes in today for follow-up. Since yesterday she's been feeling better with decreased bleeding. She is also had less pain.  Current Meds:  Outpatient Encounter Prescriptions as of 08/23/2014  Medication Sig  . aspirin-acetaminophen-caffeine (EXCEDRIN MIGRAINE) 250-250-65 MG per tablet Take 1 tablet by mouth every 6 (six) hours as needed for headache or migraine.  . citalopram (CELEXA) 40 MG tablet Take 40 mg by  mouth every morning.   Marland Kitchen HYDROcodone-acetaminophen (NORCO/VICODIN) 5-325 MG per tablet Take 1 tablet by mouth every 6 (six) hours as needed for moderate pain (for cluster headaches).  Marland Kitchen ibuprofen (ADVIL,MOTRIN) 600 MG tablet Take 1 tablet (600 mg total) by mouth every 6 (six) hours as needed for headache or mild pain.  Marland Kitchen ibuprofen (ADVIL,MOTRIN) 800 MG tablet Take 1 tablet (800 mg total) by mouth every 8 (eight)  hours as needed.  Marland Kitchen lisdexamfetamine (VYVANSE) 70 MG capsule Take 70 mg by mouth every morning.  . Multiple Vitamins-Minerals (HM MULTIVITAMIN ADULT GUMMY) CHEW Chew 1 tablet by mouth daily.  . norethindrone-ethinyl estradiol 1/35 (ORTHO-NOVUM, NORTREL,CYCLAFEM) tablet Take 1 tablet by mouth every morning.   . pantoprazole (PROTONIX) 40 MG tablet Take 40 mg by mouth every morning.   . ranitidine (ZANTAC) 300 MG tablet Take 300 mg by mouth at bedtime.  . SUMAtriptan (IMITREX) 25 MG tablet Take 25 mg by mouth once as needed for migraine. May repeat in 2 hours if headache persists or recurs.  . traZODone (DESYREL) 50 MG tablet Take 50 mg by mouth at bedtime.  . valACYclovir (VALTREX) 500 MG tablet Take 500 mg by mouth every morning.   . promethazine (PHENERGAN) 12.5 MG tablet Take 1 tablet (12.5 mg total) by mouth every 6 (six) hours as needed for nausea or vomiting. (Patient not taking: Reported on 08/23/2014)  . [DISCONTINUED] meperidine (DEMEROL) 50 MG tablet Take 1 tablet (50 mg total) by mouth every 4 (four) hours as needed for severe pain.   No facility-administered encounter medications on file as of 08/23/2014.    Allergy: No Known Allergies  Social Hx:   History   Social History  . Marital Status: Single    Spouse Name: N/A  . Number of Children: N/A  . Years of Education: N/A   Occupational History  . Not on file.   Social History Main Topics  . Smoking status: Never Smoker   . Smokeless tobacco: Never Used  . Alcohol Use: 1.0 oz/week    2 Standard drinks or equivalent per week     Comment: social   . Drug Use: No  . Sexual Activity: Not Currently   Other Topics Concern  . Not on file   Social History Narrative    Past Surgical Hx:  Past Surgical History  Procedure Laterality Date  . Colonoscopy  last one 2012  . Metatarsal osteotomy with bunionectomy Bilateral left 05-16-2014//   right 12/ 2014  . Laser ablation condolamata N/A 09/28/2013    Procedure: CO2 LASER  ABLATION CONDYLOMA;  Surgeon: Geryl Rankins, MD;  Location: [facility];  Service: Gynecology;  Laterality: N/A;  . Esophageal biopsy N/A 09/28/2013    Procedure: VULVAR BIOPSY;  Surgeon: Geryl Rankins, MD;  Location: [facility];  Service: Gynecology;  Laterality: N/A;  . Wisdom tooth extraction  1998  . Tonsillectomy  2000  . Negative sleep study  12-14-2010  . Vulvectomy N/A 08/14/2014    Procedure: WIDE LOCAL EXCISION VULVA;  Surgeon: Adolphus Birchwood, MD;  Location: [facility];  Service: Gynecology;  Laterality: N/A;    Past Medical Hx:  Past Medical History  Diagnosis Date  . Insomnia   . Anxiety   . ADD (attention deficit disorder)   . History of esophagitis     grade b  . GERD (gastroesophageal reflux disease)   . Mass of labium   . History of condyloma acuminatum     vulva  . Genital herpes  simplex type 2   . Hyperkeratosis     RIGHT ANTERIOR VULVA  . Cluster headaches   . Frequency of urination   . Wears contact lenses     Past Gynecological History:  HSV and papilloma/condyloma, s/p laser of vulva. Patient's last menstrual period was 07/30/2014.  Family Hx:  Family History  Problem Relation Age of Onset  . Diabetes Paternal Aunt   . Charcot-Marie-Tooth disease Maternal Grandmother   . Heart disease Maternal Grandfather   . AAA (abdominal aortic aneurysm) Maternal Grandfather   . Glaucoma Maternal Grandfather   . Diabetes Paternal Grandmother   . Prostate cancer Paternal Grandfather   . Diabetes Paternal Grandfather    Vitals:  Blood pressure 117/78, temperature 98.1 F (36.7 C), temperature source Oral, resp. rate 16, height  (1.702 m), weight 200 lb 9.6 oz (90.992 kg), last menstrual period 07/30/2014, SpO2 100 %.  Physical Exam:  pelvic: Complete separation of the vulvar incision. There are a few sutures. There are no longer holding the skin edges together. The total area measures approximately 3 x 3 cm. There is exudate. There is no purulence there is  no tenderness to examination and the edges are clean.   Dameion Briles A., MD   08/23/2014, 12:50 PM

## 2014-08-23 NOTE — Patient Instructions (Signed)
Please perform sitz bath 4 times a day as we discussed and use the Premarin cream in the area of the wound to prevent agglutination of the tissues.  Conjugated Estrogens vaginal cream What is this medicine? CONJUGATED ESTROGENS (CON ju gate ed ESS troe jenz) are a mixture of female hormones. This cream can help relieve symptoms associated with menopause.like vaginal dryness and irritation. This medicine may be used for other purposes; ask your health care provider or pharmacist if you have questions. COMMON BRAND NAME(S): Premarin What should I tell my health care provider before I take this medicine? They need to know if you have any of these conditions: -abnormal vaginal bleeding -blood vessel disease or blood clots -breast, cervical, endometrial, or uterine cancer -dementia -diabetes -gallbladder disease -heart disease or recent heart attack -high blood pressure -high cholesterol -high level of calcium in the blood -hysterectomy -kidney disease -liver disease -migraine headaches -protein C deficiency -protein S deficiency -stroke -systemic lupus erythematosus (SLE) -tobacco smoker -an unusual or allergic reaction to estrogens other medicines, foods, dyes, or preservatives -pregnant or trying to get pregnant -breast-feeding How should I use this medicine? This medicine is for use in the vagina only. Do not take by mouth. Follow the directions on the prescription label. Use at bedtime unless otherwise directed by your doctor or health care professional. Use the special applicator supplied with the cream. Wash hands before and after use. Fill the applicator with the cream and remove from the tube. Lie on your back, part and bend your knees. Insert the applicator into the vagina and push the plunger to expel the cream into the vagina. Wash the applicator with warm soapy water and rinse well. Use exactly as directed for the complete length of time prescribed. Do not stop using except on  the advice of your doctor or health care professional. Talk to your pediatrician regarding the use of this medicine in children. Special care may be needed. A patient package insert for the product will be given with each prescription and refill. Read this sheet carefully each time. The sheet may change frequently. Overdosage: If you think you have taken too much of this medicine contact a poison control center or emergency room at once. NOTE: This medicine is only for you. Do not share this medicine with others. What if I miss a dose? If you miss a dose, use it as soon as you can. If it is almost time for your next dose, use only that dose. Do not use double or extra doses. What may interact with this medicine? Do not take this medicine with any of the following medications: -aromatase inhibitors like aminoglutethimide, anastrozole, exemestane, letrozole, testolactone This medicine may also interact with the following medications: -barbiturates used for inducing sleep or treating seizures -carbamazepine -grapefruit juice -medicines for fungal infections like itraconazole and ketoconazole -raloxifene or tamoxifen -rifabutin -rifampin -rifapentine -ritonavir -some antibiotics used to treat infections -St. John's Wort -warfarin This list may not describe all possible interactions. Give your health care provider a list of all the medicines, herbs, non-prescription drugs, or dietary supplements you use. Also tell them if you smoke, drink alcohol, or use illegal drugs. Some items may interact with your medicine. What should I watch for while using this medicine? Visit your health care professional for regular checks on your progress. You will need a regular breast and pelvic exam. You should also discuss the need for regular mammograms with your health care professional, and follow his or her guidelines. This  medicine can make your body retain fluid, making your fingers, hands, or ankles swell.  Your blood pressure can go up. Contact your doctor or health care professional if you feel you are retaining fluid. If you have any reason to think you are pregnant; stop taking this medicine at once and contact your doctor or health care professional. Tobacco smoking increases the risk of getting a blood clot or having a stroke, especially if you are more than 30 years old. You are strongly advised not to smoke. If you wear contact lenses and notice visual changes, or if the lenses begin to feel uncomfortable, consult your eye care specialist. If you are going to have elective surgery, you may need to stop taking this medicine beforehand. Consult your health care professional for advice prior to scheduling the surgery. What side effects may I notice from receiving this medicine? Side effects that you should report to your doctor or health care professional as soon as possible: -allergic reactions like skin rash, itching or hives, swelling of the face, lips, or tongue -breast tissue changes or discharge -changes in vision -chest pain -confusion, trouble speaking or understanding -dark urine -general ill feeling or flu-like symptoms -light-colored stools -nausea, vomiting -pain, swelling, warmth in the leg -right upper belly pain -severe headaches -shortness of breath -sudden numbness or weakness of the face, arm or leg -trouble walking, dizziness, loss of balance or coordination -unusual vaginal bleeding -yellowing of the eyes or skin Side effects that usually do not require medical attention (report to your doctor or health care professional if they continue or are bothersome): -hair loss -increased hunger or thirst -increased urination -symptoms of vaginal infection like itching, irritation or unusual discharge -unusually weak or tired This list may not describe all possible side effects. Call your doctor for medical advice about side effects. You may report side effects to FDA at  1-800-FDA-1088. Where should I keep my medicine? Keep out of the reach of children. Store at room temperature between 15 and 30 degrees C (59 and 86 degrees F). Throw away any unused medicine after the expiration date. NOTE: This sheet is a summary. It may not cover all possible information. If you have questions about this medicine, talk to your doctor, pharmacist, or health care provider.  2015, [employer]vier/Gold Standard. (2010-04-10 09:20:36)

## 2014-08-25 ENCOUNTER — Ambulatory Visit: Payer: 59 | Admitting: Gynecology

## 2014-08-25 ENCOUNTER — Ambulatory Visit: Payer: 59 | Admitting: Podiatry

## 2014-08-31 ENCOUNTER — Other Ambulatory Visit: Payer: Self-pay | Admitting: Gastroenterology

## 2014-08-31 DIAGNOSIS — K449 Diaphragmatic hernia without obstruction or gangrene: Secondary | ICD-10-CM

## 2014-08-31 HISTORY — PX: COLONOSCOPY: SHX174

## 2014-08-31 HISTORY — DX: Diaphragmatic hernia without obstruction or gangrene: K44.9

## 2014-09-01 ENCOUNTER — Encounter: Payer: Self-pay | Admitting: Gynecology

## 2014-09-01 ENCOUNTER — Encounter: Payer: Self-pay | Admitting: *Deleted

## 2014-09-01 ENCOUNTER — Ambulatory Visit: Payer: 59 | Attending: Gynecology | Admitting: Gynecology

## 2014-09-01 DIAGNOSIS — D071 Carcinoma in situ of vulva: Secondary | ICD-10-CM | POA: Diagnosis present

## 2014-09-01 MED ORDER — SILVER SULFADIAZINE 1 % EX CREA: 1.0000 "application " | TOPICAL_CREAM | Freq: Two times a day (BID) | CUTANEOUS | Status: DC

## 2014-09-01 NOTE — Progress Notes (Signed)
Consult Note: Gyn-Onc  Consult was requested by Dr. Dion Body for the evaluation of Holly Lynch 29 y.o. female  CC:  Chief Complaint  Patient presents with  . VIN III (vulvar intraepithelial neoplasia III)    Assessment/Plan: Granulating anterior vulvar area status post wide local excision of VIN 3. This area is a healthy. The patient is not having any significant pain and I believe this fit to return to work full-time starting next Monday. Rather then using Premarin cream I recommend that she use Silvadene cream twice a day and sitz baths twice a day. She return to see Dr. Andrey Farmer as previously scheduled.  Holly Lynch  is a 29 y.o.  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 Symptomatic. Benign biopsy on 06/27/14.   Wide local excision that was recently performed revealed VIN 3 with negative margins. The patient has had complete separation of the wound.  She has been using sitz bath 4 times a day and Premarin cream to avoid agglutination of the anterior labia minora. This is been going well. The patient reports she's not having any bleeding or pain she does have some exudate and wears a pad. 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   HPI: Holly Lynch is a 29 year old woman who is seen in consultation at the request of Dr Dion Body for hyperkeratosis of the right anterior vulva.  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 She had a verrucous lesion biopsied in 2015 which was positive for condyloma. In October 2015 she underwent laser of the anterior labia. After she healed from the laser she noted a white area on the right anterior labia close the the clitoris. It makes masturbation painful. She has noted it to be increasing in size.  On 06/27/14 Dr Dion Body performed a biopsy of the lesion which revealed only hyperkeratosis. No dysplasia was seen.    After discussion of treatment options with Dr. Andrey Farmer she  elected for a wide local excision on the vulva was performed on July 25. Pathology revealed: Diagnosis 1. Labium, biopsy, Medial margin of right anterior labia minora - CONDYLOMA ACUMINATUM. - RESECTION MARGINS ARE NEGATIVE. 2. 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 - RESECTION MARGINS ARE NEGATIVE.    Current Meds:  Outpatient Encounter Prescriptions as of 09/01/2014  Medication Sig  . aspirin-acetaminophen-caffeine (EXCEDRIN MIGRAINE) 250-250-65 MG per tablet Take 1 tablet by mouth every 6 (six) hours as needed for headache or migraine.  . citalopram (CELEXA) 40 MG tablet Take 40 mg by mouth every morning.   . conjugated estrogens (PREMARIN) vaginal cream Place 1 Applicatorful vaginally daily. Use externally as directed.  Marland Kitchen HYDROcodone-acetaminophen (NORCO/VICODIN) 5-325 MG per tablet Take 1 tablet by mouth every 6 (six) hours as needed for moderate pain (for cluster headaches).  Marland Kitchen ibuprofen (ADVIL,MOTRIN) 600 MG tablet Take 1 tablet (600 mg total) by mouth every 6 (six) hours as needed for headache or mild pain.  Marland Kitchen ibuprofen (ADVIL,MOTRIN) 800 MG tablet Take 1 tablet (800 mg total) by mouth every 8 (eight) hours as needed.  Marland Kitchen lisdexamfetamine (VYVANSE) 70 MG capsule Take 70 mg by mouth every morning.  . meperidine (DEMEROL) 50 MG tablet Take 1 tablet (50 mg total) by mouth every 4 (four) hours as needed for severe pain.  . Multiple Vitamins-Minerals (HM MULTIVITAMIN ADULT GUMMY) CHEW  Chew 1 tablet by mouth daily.  . norethindrone-ethinyl estradiol 1/35 (ORTHO-NOVUM, NORTREL,CYCLAFEM) tablet Take 1 tablet by mouth every morning.   . pantoprazole (PROTONIX) 40 MG tablet Take 40 mg by mouth every morning.   . ranitidine (ZANTAC) 300 MG tablet Take 300 mg by mouth at bedtime.  . SUMAtriptan (IMITREX) 25 MG tablet Take 25 mg by mouth once as needed for migraine.  May repeat in 2 hours if headache persists or recurs.  . traZODone (DESYREL) 50 MG tablet Take 50 mg by mouth at bedtime.  . valACYclovir (VALTREX) 500 MG tablet Take 500 mg by mouth every morning.   . promethazine (PHENERGAN) 12.5 MG tablet Take 1 tablet (12.5 mg total) by mouth every 6 (six) hours as needed for nausea or vomiting. (Patient not taking: Reported on 08/23/2014)   No facility-administered encounter medications on file as of 09/01/2014.    Allergy: No Known Allergies  Social Hx:   Social History   Social History  . Marital Status: Single    Spouse Name: N/A  . Number of Children: N/A  . Years of Education: N/A   Occupational History  . Not on file.   Social History Main Topics  . Smoking status: Never Smoker   . Smokeless tobacco: Never Used  . Alcohol Use: 1.0 oz/week    2 Standard drinks or equivalent per week     Comment: social   . Drug Use: No  . Sexual Activity: Not Currently   Other Topics Concern  . Not on file   Social History Narrative    Past Surgical Hx:  Past Surgical History  Procedure Laterality Date  . Colonoscopy  08/31/14    Dr. Dulce Sellar  . Metatarsal osteotomy with bunionectomy Bilateral left 05-16-2014//   right 12/ 2014  . Laser ablation condolamata N/A 09/28/2013    Procedure: CO2 LASER ABLATION CONDYLOMA;  Surgeon: Geryl Rankins, MD;  Location: [facility];  Service: Gynecology;  Laterality: N/A;  . Esophageal biopsy N/A 09/28/2013    Procedure: VULVAR BIOPSY;  Surgeon: Geryl Rankins, MD;  Location: [facility];  Service: Gynecology;  Laterality: N/A;  . Wisdom tooth extraction  1998  . Tonsillectomy  2000  . Negative sleep study  12-14-2010  . Vulvectomy N/A 08/14/2014    Procedure: WIDE LOCAL EXCISION VULVA;  Surgeon: Adolphus Birchwood, MD;  Location: [facility];  Service: Gynecology;  Laterality: N/A;    Past Medical Hx:  Past Medical History  Diagnosis Date  . Insomnia   . Anxiety   . ADD (attention deficit disorder)   .  History of esophagitis     grade b  . GERD (gastroesophageal reflux disease)   . Mass of labium   . History of condyloma acuminatum     vulva  . Genital herpes simplex type 2   . Hyperkeratosis     RIGHT ANTERIOR VULVA  . Cluster headaches   . Frequency of urination   . Wears contact lenses   . Hiatal hernia 08/31/14    colonoscopy done 8/11 by Dr. Dulce Sellar    Past Gynecological History:  HSV and papilloma/condyloma, s/p laser of vulva. Patient's last menstrual period was 07/30/2014.  Family Hx:  Family History  Problem Relation Age of Onset  . Diabetes Paternal Aunt   . Charcot-Marie-Tooth disease Maternal Grandmother   . Heart disease Maternal Grandfather   . AAA (abdominal aortic aneurysm) Maternal Grandfather   . Glaucoma Maternal Grandfather   . Diabetes Paternal Grandmother   .  Prostate cancer Paternal Grandfather   . Diabetes Paternal Grandfather    Vitals:  Blood pressure 124/64, pulse 80, temperature 99.2 F (37.3 C), temperature source Oral, resp. rate 18, height  (1.702 m), weight 195 lb 1.6 oz (88.497 kg), last menstrual period 07/30/2014, SpO2 99 %.  Physical Exam:  pelvic: Complete separation of the vulvar incision. There are a few sutures. There are no longer holding the skin edges together. The total area measures approximately 3 x 3 cm. There is exudate. There is no purulence there is no tenderness to examination and the edges are clean.   CLARKE-PEARSON,Tannar Broker L, MD   09/01/2014, 11:00 AM

## 2014-09-01 NOTE — Patient Instructions (Addendum)
Follow-up with Dr. Andrey Farmer as scheduled. Please call our office sooner with any concerns or if you would like to reschedule your next appointment.  Apply Silvadene twice a day. Use sitz baths twice a day. He may return to work on Monday.

## 2014-09-08 ENCOUNTER — Ambulatory Visit: Payer: 59 | Attending: Gynecologic Oncology | Admitting: Gynecologic Oncology

## 2014-09-08 ENCOUNTER — Ambulatory Visit: Payer: 59 | Admitting: Gynecologic Oncology

## 2014-09-08 ENCOUNTER — Encounter: Payer: Self-pay | Admitting: Gynecologic Oncology

## 2014-09-08 VITALS — BP 127/70 | HR 98 | Temp 98.4°F | Resp 18 | Ht 67.0 in | Wt 195.4 lb

## 2014-09-08 DIAGNOSIS — D071 Carcinoma in situ of vulva: Secondary | ICD-10-CM

## 2014-09-08 NOTE — Patient Instructions (Addendum)
Plan to follow up with Dr. Dion Body in six months or sooner if needed.  Please call for any questions or concerns.  Follow up with Dr. Andrey Farmer as needed.

## 2014-09-08 NOTE — Progress Notes (Signed)
Consult Note: Gyn-Onc  Consult was requested by Dr. Dion Body for the evaluation of Holly Lynch 29 y.o. female  CC:  Chief Complaint  Patient presents with  . 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 This area is a healthy. I recommend that she continue to use Silvadene cream twice a day and sitz baths twice a day.   We discussed the pathology results. We discussed that she has a 25% risk of recurrence of her VIN and I recommend 6 monthly surveillance visits with Dr Idamae Schuller for 2 years before resuming annual visits again.   HPI: Holly Lynch is a 29 year old woman who is seen in consultation at the request of Dr Dion Body for hyperkeratosis of the right anterior vulva.  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 She had a verrucous lesion biopsied in 2015 which was positive for condyloma. In October 2015 she underwent laser of the anterior labia. After she healed from the laser she noted a white area on the right anterior labia close the the clitoris. It makes masturbation painful. She has noted it to be increasing in size.  On 06/27/14 Dr Dion Body performed a biopsy of the lesion which revealed only hyperkeratosis. No dysplasia was seen.    After discussion of treatment options with Dr. Andrey Farmer she elected for a wide local excision on the vulva was performed on July 25. Pathology revealed: Diagnosis 1. Labium, biopsy, Medial margin of right anterior labia minora - CONDYLOMA ACUMINATUM. - RESECTION MARGINS ARE NEGATIVE. 2. 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 - RESECTION MARGINS ARE NEGATIVE.  Postoperatively she had some wound separation. No infection.  Interval Hx: her wound is getting better with decreasing discharge and  exudate.  Current Meds:  Outpatient Encounter Prescriptions as of 09/08/2014  Medication Sig  . aspirin-acetaminophen-caffeine (EXCEDRIN MIGRAINE) 250-250-65 MG per tablet Take 1 tablet by mouth every 6 (six) hours as needed for headache or migraine.  . citalopram (CELEXA) 40 MG tablet Take 40 mg by mouth every morning.   . conjugated estrogens (PREMARIN) vaginal cream Place 1 Applicatorful vaginally daily. Use externally as directed.  Marland Kitchen HYDROcodone-acetaminophen (NORCO/VICODIN) 5-325 MG per tablet Take 1 tablet by mouth every 6 (six) hours as needed for moderate pain (for cluster headaches).  Marland Kitchen ibuprofen (ADVIL,MOTRIN) 600 MG tablet Take 1 tablet (600 mg total) by mouth every 6 (six) hours as needed for headache or mild pain.  Marland Kitchen ibuprofen (ADVIL,MOTRIN) 800 MG tablet Take 1 tablet (800 mg total) by mouth every 8 (eight) hours as needed.  Marland Kitchen lisdexamfetamine (VYVANSE) 70 MG capsule Take 70 mg by mouth every morning.  . meperidine (DEMEROL) 50 MG tablet Take 1 tablet (50 mg total) by mouth every 4 (four) hours as needed for severe pain.  . Multiple Vitamins-Minerals (HM MULTIVITAMIN ADULT GUMMY) CHEW Chew 1 tablet by mouth daily.  . norethindrone-ethinyl estradiol 1/35 (ORTHO-NOVUM, NORTREL,CYCLAFEM) tablet Take 1 tablet by mouth every morning.   . pantoprazole (PROTONIX) 40 MG tablet Take 40 mg by mouth every morning.   . promethazine (PHENERGAN) 12.5 MG tablet Take 1 tablet (12.5 mg total) by mouth every 6 (six) hours as needed for nausea or vomiting. (Patient not taking: Reported on 08/23/2014)  . ranitidine (ZANTAC) 300 MG tablet Take 300 mg by mouth at  bedtime.  . silver sulfADIAZINE (SILVADENE) 1 % cream Apply 1 application topically 2 (two) times daily.  . SUMAtriptan (IMITREX) 25 MG tablet Take 25 mg by mouth once as needed for migraine. May repeat in 2 hours if headache persists or recurs.  . traZODone (DESYREL) 50 MG tablet Take 50 mg by mouth at bedtime.  . valACYclovir (VALTREX) 500 MG  tablet Take 500 mg by mouth every morning.    No facility-administered encounter medications on file as of 09/08/2014.    Allergy: No Known Allergies  Social Hx:   Social History   Social History  . Marital Status: Single    Spouse Name: N/A  . Number of Children: N/A  . Years of Education: N/A   Occupational History  . Not on file.   Social History Main Topics  . Smoking status: Never Smoker   . Smokeless tobacco: Never Used  . Alcohol Use: 1.0 oz/week    2 Standard drinks or equivalent per week     Comment: social   . Drug Use: No  . Sexual Activity: Not Currently   Other Topics Concern  . Not on file   Social History Narrative    Past Surgical Hx:  Past Surgical History  Procedure Laterality Date  . Colonoscopy  08/31/14    Dr. Dulce Sellar  . Metatarsal osteotomy with bunionectomy Bilateral left 05-16-2014//   right 12/ 2014  . Laser ablation condolamata N/A 09/28/2013    Procedure: CO2 LASER ABLATION CONDYLOMA;  Surgeon: Geryl Rankins, MD;  Location: [facility];  Service: Gynecology;  Laterality: N/A;  . Esophageal biopsy N/A 09/28/2013    Procedure: VULVAR BIOPSY;  Surgeon: Geryl Rankins, MD;  Location: [facility];  Service: Gynecology;  Laterality: N/A;  . Wisdom tooth extraction  1998  . Tonsillectomy  2000  . Negative sleep study  12-14-2010  . Vulvectomy N/A 08/14/2014    Procedure: WIDE LOCAL EXCISION VULVA;  Surgeon: Adolphus Birchwood, MD;  Location: [facility];  Service: Gynecology;  Laterality: N/A;    Past Medical Hx:  Past Medical History  Diagnosis Date  . Insomnia   . Anxiety   . ADD (attention deficit disorder)   . History of esophagitis     grade b  . GERD (gastroesophageal reflux disease)   . Mass of labium   . History of condyloma acuminatum     vulva  . Genital herpes simplex type 2   . Hyperkeratosis     RIGHT ANTERIOR VULVA  . Cluster headaches   . Frequency of urination   . Wears contact lenses   . Hiatal hernia 08/31/14     colonoscopy done 8/11 by Dr. Dulce Sellar    Past Gynecological History:  HSV and papilloma/condyloma, s/p laser of vulva. No LMP recorded.  Family Hx:  Family History  Problem Relation Age of Onset  . Diabetes Paternal Aunt   . Charcot-Marie-Tooth disease Maternal Grandmother   . Heart disease Maternal Grandfather   . AAA (abdominal aortic aneurysm) Maternal Grandfather   . Glaucoma Maternal Grandfather   . Diabetes Paternal Grandmother   . Prostate cancer Paternal Grandfather   . Diabetes Paternal Grandfather    Vitals:  Blood pressure 127/70, pulse 98, temperature 98.4 F (36.9 C), temperature source Oral, resp. rate 18, height  (1.702 m), weight 195 lb 6.4 oz (88.633 kg), SpO2 96 %.  Physical Exam:  pelvic: Complete separation of the vulvar incision. There is one sutures. The total area measures approximately 3 x  3 cm. There is exudate. There is no purulence there is no tenderness to examination and the edges are clean.   [name], MD   09/08/2014, 12:26 PM

## 2014-09-11 ENCOUNTER — Ambulatory Visit: Payer: 59 | Admitting: Gynecologic Oncology

## 2014-10-04 ENCOUNTER — Ambulatory Visit (INDEPENDENT_AMBULATORY_CARE_PROVIDER_SITE_OTHER): Payer: 59

## 2014-10-04 ENCOUNTER — Encounter: Payer: Self-pay | Admitting: Podiatry

## 2014-10-04 ENCOUNTER — Ambulatory Visit (INDEPENDENT_AMBULATORY_CARE_PROVIDER_SITE_OTHER): Payer: 59 | Admitting: Podiatry

## 2014-10-04 VITALS — BP 123/83 | HR 108 | Resp 16

## 2014-10-04 DIAGNOSIS — Z9889 Other specified postprocedural states: Secondary | ICD-10-CM | POA: Diagnosis not present

## 2014-10-04 DIAGNOSIS — M779 Enthesopathy, unspecified: Secondary | ICD-10-CM | POA: Diagnosis not present

## 2014-10-04 DIAGNOSIS — M8430XA Stress fracture, unspecified site, initial encounter for fracture: Secondary | ICD-10-CM

## 2014-10-04 NOTE — Progress Notes (Signed)
Subjective:     Patient ID: Holly Lynch, female   DOB: Nov 05, 1985, 29 y.o.   MRN: [id number]  HPI patient states I was out a work for about 3 weeks and that really help my left foot with swelling going down significantly and the pain reduced   Review of Systems     Objective:   Physical Exam Neurovascular status intact muscle strength was adequate with continued discomfort in the second metatarsal shaft which has improved quite a bit from previous visit    Assessment:     Doing much better with structural surgery left foot with possible stress fracture second left    Plan:     X-ray indicated there was a fracture of the neck of the second metatarsal left which hopefully over time will heal fine. Patient will be seen back to recheck

## 2014-10-12 ENCOUNTER — Telehealth: Payer: Self-pay | Admitting: *Deleted

## 2014-10-12 NOTE — Telephone Encounter (Signed)
Receieved call from patient stating that "an area directly below my clitoris popped up over the last few days." Per pt, the area is bigger than the size of a pencil eraser and looks red and beefy." Pt denies that the area looks like a herpes lesion. Pt also denies any trauma to the area. Offered patient to either observe the area for several days or come in for appt tomorrow. Pt would like to come in tomorrow. Pt scheduled for 2:45pm with Dr. Andrey Farmer - patient agreeable to appt and no other concerns noted at this time.

## 2014-10-13 ENCOUNTER — Ambulatory Visit: Payer: 59 | Attending: Gynecologic Oncology | Admitting: Gynecologic Oncology

## 2014-10-13 ENCOUNTER — Encounter: Payer: Self-pay | Admitting: Gynecologic Oncology

## 2014-10-13 VITALS — BP 133/78 | HR 82 | Temp 98.0°F | Resp 18 | Ht 67.0 in | Wt 191.2 lb

## 2014-10-13 DIAGNOSIS — N9089 Other specified noninflammatory disorders of vulva and perineum: Secondary | ICD-10-CM | POA: Insufficient documentation

## 2014-10-13 DIAGNOSIS — L859 Epidermal thickening, unspecified: Secondary | ICD-10-CM | POA: Diagnosis not present

## 2014-10-13 NOTE — Patient Instructions (Signed)
We will contact you with the results of your biopsy.  Please call from any questions or concerns.

## 2014-10-13 NOTE — Progress Notes (Signed)
Followup Note: Gyn-Onc  CC:  Chief Complaint  Patient presents with  . vulvar lesion    Assessment/Plan: New vulvar lesion status post wide local excision of VIN 3. Biopsied today - appears most consistent with granulation tissue and hyperkeratosis. If VIN3 is found, recommend excision vs laser. If negative for dysplasia I recommend 6 monthly surveillance visits with Dr Idamae Schuller for 2 years before resuming annual visits again.   HPI: Holly Lynch is a 29 year old woman who is seen in consultation at the request of Dr Dion Body for hyperkeratosis of the right anterior vulva.  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 She had a verrucous lesion biopsied in 2015 which was positive for condyloma. In October 2015 she underwent laser of the anterior labia. After she healed from the laser she noted a white area on the right anterior labia close the the clitoris. It makes masturbation painful. She has noted it to be increasing in size.  On 06/27/14 Dr Dion Body performed a biopsy of the lesion which revealed only hyperkeratosis. No dysplasia was seen.    After discussion of treatment options with Dr. Andrey Farmer she elected for a wide local excision on the vulva was performed on July 25. Pathology revealed: Diagnosis 1. Labium, biopsy, Medial margin of right anterior labia minora - CONDYLOMA ACUMINATUM. - RESECTION MARGINS ARE NEGATIVE. 2. 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 - RESECTION MARGINS ARE NEGATIVE.  Postoperatively she had some wound separation. No infection.  Interval Hx: she developed a new "beefy red" area immediately below the clitoris in the past week. It is not painful.  Current Meds:  Outpatient Encounter Prescriptions as of 10/13/2014  Medication Sig  . aspirin-acetaminophen-caffeine (EXCEDRIN MIGRAINE) 250-250-65  MG per tablet Take 1 tablet by mouth every 6 (six) hours as needed for headache or migraine.  . citalopram (CELEXA) 40 MG tablet Take 40 mg by mouth every morning.   . conjugated estrogens (PREMARIN) vaginal cream Place 1 Applicatorful vaginally daily. Use externally as directed.  . dicyclomine (BENTYL) 10 MG capsule Take 10 mg by mouth 2 (two) times daily.   Marland Kitchen ibuprofen (ADVIL,MOTRIN) 600 MG tablet Take 1 tablet (600 mg total) by mouth every 6 (six) hours as needed for headache or mild pain.  Marland Kitchen ibuprofen (ADVIL,MOTRIN) 800 MG tablet Take 1 tablet (800 mg total) by mouth every 8 (eight) hours as needed.  Marland Kitchen lisdexamfetamine (VYVANSE) 70 MG capsule Take 70 mg by mouth every morning.  . meperidine (DEMEROL) 50 MG tablet Take 1 tablet (50 mg total) by mouth every 4 (four) hours as needed for severe pain.  . Multiple Vitamins-Minerals (HM MULTIVITAMIN ADULT GUMMY) CHEW Chew 1 tablet by mouth daily.  . norethindrone-ethinyl estradiol 1/35 (ORTHO-NOVUM, NORTREL,CYCLAFEM) tablet Take 1 tablet by mouth every morning.   . pantoprazole (PROTONIX) 40 MG tablet Take 40 mg by mouth every morning.   . ranitidine (ZANTAC) 300 MG tablet Take 300 mg by mouth at bedtime.  . silver sulfADIAZINE (SILVADENE) 1 % cream Apply 1 application topically 2 (two) times daily.  . SUMAtriptan (IMITREX) 25 MG tablet Take 25 mg by mouth once as needed for migraine. May repeat in 2 hours if headache persists or recurs.  . traZODone (DESYREL) 50 MG tablet Take 100 mg by mouth at bedtime.   . valACYclovir (VALTREX) 500 MG tablet Take 500 mg by mouth every morning.   Marland Kitchen  HYDROcodone-acetaminophen (NORCO/VICODIN) 5-325 MG per tablet Take 1 tablet by mouth every 6 (six) hours as needed for moderate pain (for cluster headaches).  . promethazine (PHENERGAN) 12.5 MG tablet Take 1 tablet (12.5 mg total) by mouth every 6 (six) hours as needed for nausea or vomiting. (Patient not taking: Reported on 10/13/2014)  . [DISCONTINUED] SUMAtriptan  (IMITREX) 100 MG tablet    No facility-administered encounter medications on file as of 10/13/2014.    Allergy: No Known Allergies  Social Hx:   Social History   Social History  . Marital Status: Single    Spouse Name: N/A  . Number of Children: N/A  . Years of Education: N/A   Occupational History  . Not on file.   Social History Main Topics  . Smoking status: Never Smoker   . Smokeless tobacco: Never Used  . Alcohol Use: 1.0 oz/week    2 Standard drinks or equivalent per week     Comment: social   . Drug Use: No  . Sexual Activity: Not Currently   Other Topics Concern  . Not on file   Social History Narrative    Past Surgical Hx:  Past Surgical History  Procedure Laterality Date  . Colonoscopy  08/31/14    Dr. Dulce Sellar  . Metatarsal osteotomy with bunionectomy Bilateral left 05-16-2014//   right 12/ 2014  . Laser ablation condolamata N/A 09/28/2013    Procedure: CO2 LASER ABLATION CONDYLOMA;  Surgeon: Geryl Rankins, MD;  Location: [facility];  Service: Gynecology;  Laterality: N/A;  . Esophageal biopsy N/A 09/28/2013    Procedure: VULVAR BIOPSY;  Surgeon: Geryl Rankins, MD;  Location: [facility];  Service: Gynecology;  Laterality: N/A;  . Wisdom tooth extraction  1998  . Tonsillectomy  2000  . Negative sleep study  12-14-2010  . Vulvectomy N/A 08/14/2014    Procedure: WIDE LOCAL EXCISION VULVA;  Surgeon: Adolphus Birchwood, MD;  Location: [facility];  Service: Gynecology;  Laterality: N/A;    Past Medical Hx:  Past Medical History  Diagnosis Date  . Insomnia   . Anxiety   . ADD (attention deficit disorder)   . History of esophagitis     grade b  . GERD (gastroesophageal reflux disease)   . Mass of labium   . History of condyloma acuminatum     vulva  . Genital herpes simplex type 2   . Hyperkeratosis     RIGHT ANTERIOR VULVA  . Cluster headaches   . Frequency of urination   . Wears contact lenses   . Hiatal hernia 08/31/14    colonoscopy done 8/11 by  Dr. Dulce Sellar    Past Gynecological History:  HSV and papilloma/condyloma, s/p laser of vulva. No LMP recorded.  Family Hx:  Family History  Problem Relation Age of Onset  . Diabetes Paternal Aunt   . Charcot-Marie-Tooth disease Maternal Grandmother   . Heart disease Maternal Grandfather   . AAA (abdominal aortic aneurysm) Maternal Grandfather   . Glaucoma Maternal Grandfather   . Diabetes Paternal Grandmother   . Prostate cancer Paternal Grandfather   . Diabetes Paternal Grandfather    Vitals:  Blood pressure 133/78, pulse 82, temperature 98 F (36.7 C), temperature source Oral, resp. rate 18, height  (1.702 m), weight 191 lb 3.2 oz (86.728 kg), SpO2 99 %.  Physical Exam:  pelvic: Vulvar wound appears healed with exception of medial aspect/immediately below clitoris. There is a circular 1cm area of raw denuded skin. Immediately lateral to this on the  right is a 2mm area of leukoplakia that is dense and hard like hyperkeratosis. Biopsy was performed.  Procedure: Vulva biopsy Verbal consent was obtained. THe skin was prepped with betadine. The area was infiltrated with 1cc of 1% lidocaine. A 2mm punch biopsy was taken to include both the area of hyperkeratosis and the erythematous lesion. Hemostasis was obtained with silver nitrate.   [name], MD   10/13/2014, 3:12 PM

## 2014-10-17 ENCOUNTER — Telehealth: Payer: Self-pay | Admitting: Gynecologic Oncology

## 2014-10-17 NOTE — Telephone Encounter (Signed)
Informed patient of finding of condyloma on biopsy. No dysplasia.  Recommend that she continues to heal. If she notes recurrence of the warty disease, she can discuss with her gynecologist having topical therapy to resolve this. I discussed that warty disease is typically caused by low risk HPV and is usually not associated with progression to VIN or invasive cancer.  [name], MD

## 2014-11-03 ENCOUNTER — Ambulatory Visit (INDEPENDENT_AMBULATORY_CARE_PROVIDER_SITE_OTHER): Payer: 59 | Admitting: Podiatry

## 2014-11-03 ENCOUNTER — Encounter: Payer: Self-pay | Admitting: Podiatry

## 2014-11-03 ENCOUNTER — Ambulatory Visit (INDEPENDENT_AMBULATORY_CARE_PROVIDER_SITE_OTHER): Payer: 59

## 2014-11-03 VITALS — BP 122/72 | HR 90 | Resp 16

## 2014-11-03 DIAGNOSIS — M8430XD Stress fracture, unspecified site, subsequent encounter for fracture with routine healing: Secondary | ICD-10-CM | POA: Diagnosis not present

## 2014-11-03 DIAGNOSIS — M779 Enthesopathy, unspecified: Secondary | ICD-10-CM

## 2014-11-03 DIAGNOSIS — M8430XA Stress fracture, unspecified site, initial encounter for fracture: Secondary | ICD-10-CM

## 2014-11-03 MED ORDER — MELOXICAM 15 MG PO TABS: 15.0000 mg | ORAL_TABLET | Freq: Every day | ORAL | Status: DC

## 2014-11-05 NOTE — Progress Notes (Signed)
Subjective:     Patient ID: Holly Lynch, female   DOB: May 06, 1985, 29 y.o.   MRN: [id number]  HPI patient states that where you operated doing fine and red think I had the broken bone is fine but it hurts more to the side and I was just worried I could be developing another stress fracture or problem   Review of Systems     Objective:   Physical Exam Neurovascular status unchanged with discomfort around the third and fourth metatarsal phalangeal joint with minimal discomfort within the shafts of the metatarsals themselves    Assessment:     Most likely this is inflammation around this area and currently does not appear to be stress fracture or inflammatory capsulitis    Plan:     X-rays reviewed with patient and at this time I did apply a metatarsal pad to take stress off the joint and explained that she may need to use her boot some as needed and she'll be seen back if any issues were to occur

## 2014-11-13 ENCOUNTER — Other Ambulatory Visit (HOSPITAL_COMMUNITY)
Admission: RE | Admit: 2014-11-13 | Discharge: 2014-11-13 | Disposition: A | Discharge status: Home / Self Care | Payer: 59 | Source: Ambulatory Visit | Attending: Obstetrics and Gynecology | Admitting: Obstetrics and Gynecology

## 2014-11-13 ENCOUNTER — Other Ambulatory Visit: Payer: Self-pay | Admitting: Obstetrics and Gynecology

## 2014-11-13 DIAGNOSIS — Z1151 Encounter for screening for human papillomavirus (HPV): Secondary | ICD-10-CM | POA: Diagnosis present

## 2014-11-13 DIAGNOSIS — Z01419 Encounter for gynecological examination (general) (routine) without abnormal findings: Secondary | ICD-10-CM | POA: Diagnosis present

## 2014-11-14 LAB — CYTOLOGY - PAP

## 2014-11-18 DIAGNOSIS — F909 Attention-deficit hyperactivity disorder, unspecified type: Secondary | ICD-10-CM | POA: Insufficient documentation

## 2014-11-18 DIAGNOSIS — Z8742 Personal history of other diseases of the female genital tract: Secondary | ICD-10-CM | POA: Insufficient documentation

## 2014-11-18 DIAGNOSIS — Z8619 Personal history of other infectious and parasitic diseases: Secondary | ICD-10-CM | POA: Diagnosis not present

## 2014-11-18 DIAGNOSIS — F419 Anxiety disorder, unspecified: Secondary | ICD-10-CM | POA: Diagnosis not present

## 2014-11-18 DIAGNOSIS — G4700 Insomnia, unspecified: Secondary | ICD-10-CM | POA: Diagnosis not present

## 2014-11-18 DIAGNOSIS — Z79899 Other long term (current) drug therapy: Secondary | ICD-10-CM | POA: Diagnosis not present

## 2014-11-18 DIAGNOSIS — K219 Gastro-esophageal reflux disease without esophagitis: Secondary | ICD-10-CM | POA: Insufficient documentation

## 2014-11-18 DIAGNOSIS — Z872 Personal history of diseases of the skin and subcutaneous tissue: Secondary | ICD-10-CM | POA: Diagnosis not present

## 2014-11-18 DIAGNOSIS — H538 Other visual disturbances: Secondary | ICD-10-CM | POA: Diagnosis not present

## 2014-11-19 ENCOUNTER — Emergency Department (HOSPITAL_COMMUNITY)
Admission: EM | Admit: 2014-11-19 | Discharge: 2014-11-19 | Disposition: A | Discharge status: Home / Self Care | Payer: 59 | Attending: Emergency Medicine | Admitting: Emergency Medicine

## 2014-11-19 ENCOUNTER — Encounter (HOSPITAL_COMMUNITY): Payer: Self-pay | Admitting: Emergency Medicine

## 2014-11-19 DIAGNOSIS — H547 Unspecified visual loss: Secondary | ICD-10-CM

## 2014-11-19 MED ORDER — FLUORESCEIN SODIUM 1 MG OP STRP
1.0000 | ORAL_STRIP | Freq: Once | OPHTHALMIC | Status: AC
  Administered 2014-11-19: 1 via OPHTHALMIC
  Filled 2014-11-19: qty 1

## 2014-11-19 MED ORDER — TETRACAINE HCL 0.5 % OP SOLN
1.0000 [drp] | Freq: Once | OPHTHALMIC | Status: AC
  Administered 2014-11-19: 1 [drp] via OPHTHALMIC
  Filled 2014-11-19: qty 2

## 2014-11-19 NOTE — ED Notes (Signed)
Bed: NW29WA10 Expected date:  Expected time:  Means of arrival:  Comments: Hold for FM

## 2014-11-19 NOTE — ED Notes (Addendum)
Pt. Came in to ED with complaint of intermittent episodes of vision changes on the right eye only , which this is the 3rd episode this week. Denies pain , right eye  Vision looks darker , left eye vision normal. Was told by MD that it could be potential retinal detachment.  No other complaint , denies dizziness , denies blurry vision.

## 2014-11-19 NOTE — Discharge Instructions (Signed)
Visual Disturbances °You have had a disturbance in your vision. This may be caused by various conditions, such as: °· Migraines. Migraine headaches are often preceded by a disturbance in vision. Blind spots or light flashes are followed by a headache. This type of visual disturbance is temporary. It does not damage the eye. °· Glaucoma. This is caused by increased pressure in the eye. Symptoms include haziness, blurred vision, or seeing rainbow colored circles when looking at bright lights. Partial or complete visual loss can occur. You may or may not experience eye pain. Visual loss may be gradual or sudden and is irreversible. Glaucoma is the leading cause of blindness. °· Retina problems. Vision will be reduced if the retina becomes detached or if there is a circulation problem as with diabetes, high blood pressure, or a mini-stroke. Symptoms include seeing "floaters," flashes of light, or shadows, as if a curtain has fallen over your eye. °· Optic nerve problems. The main nerve in your eye can be damaged by redness, soreness, and swelling (inflammation), poor circulation, drugs, and toxins. °It is very important to have a complete exam done by a specialist to determine the exact cause of your eye problem. The specialist may recommend medicines or surgery, depending on the cause of the problem. This can help prevent further loss of vision or reduce the risk of having a stroke. Contact the caregiver to whom you have been referred and arrange for follow-up care right away. °SEEK IMMEDIATE MEDICAL CARE IF:  °· Your vision gets worse. °· You develop severe headaches. °· You have any weakness or numbness in the face, arms, or legs. °· You have any trouble speaking or walking. °  °This information is not intended to replace advice given to you by your health care provider. Make sure you discuss any questions you have with your health care provider. °  °Document Released: 02/14/2004 Document Revised: 03/31/2011 Document  Reviewed: 06/15/2013 °Elsevier Interactive Patient Education ©2016 [employer] ° °

## 2014-11-19 NOTE — ED Provider Notes (Signed)
CSN: [id number]     Arrival date & time 11/18/14  2359 History   First MD Initiated Contact with Patient 11/19/14 0014     Chief Complaint  Patient presents with  . vision changes      (Consider location/radiation/quality/duration/timing/severity/associated sxs/prior Treatment) Patient is a 29 y.o. female presenting with eye problem. The history is provided by the patient.  Eye Problem Location:  R eye Quality: loss of vision. Severity:  Moderate Onset quality:  Gradual Timing:  Constant Progression:  Resolved Chronicity:  New Context: contact lenses   Context: not direct trauma, not foreign body and not scratch   Relieved by:  Nothing Worsened by:  Nothing tried Ineffective treatments:  None tried Associated symptoms: decreased vision (at night in low light) and foreign body sensation (bilaterally irritated)   Associated symptoms: no double vision and no photophobia   Risk factors: no previous injury to eye     Past Medical History  Diagnosis Date  . Insomnia   . Anxiety   . ADD (attention deficit disorder)   . History of esophagitis     grade b  . GERD (gastroesophageal reflux disease)   . Mass of labium   . History of condyloma acuminatum     vulva  . Genital herpes simplex type 2   . Hyperkeratosis     RIGHT ANTERIOR VULVA  . Cluster headaches   . Frequency of urination   . Wears contact lenses   . Hiatal hernia 08/31/14    colonoscopy done 8/11 by Dr. Dulce Sellarutlaw   Past Surgical History  Procedure Laterality Date  . Colonoscopy  08/31/14    Dr. Dulce Sellarutlaw  . Metatarsal osteotomy with bunionectomy Bilateral left 05-16-2014//   right 12/ 2014  . Laser ablation condolamata N/A 09/28/2013    Procedure: CO2 LASER ABLATION CONDYLOMA;  Surgeon: Geryl RankinsEvelyn Varnado, MD;  Location: [facility];  Service: Gynecology;  Laterality: N/A;  . Esophageal biopsy N/A 09/28/2013    Procedure: VULVAR BIOPSY;  Surgeon: Geryl RankinsEvelyn Varnado, MD;  Location: [facility];  Service: Gynecology;  Laterality: N/A;   . Wisdom tooth extraction  1998  . Tonsillectomy  2000  . Negative sleep study  12-14-2010  . Vulvectomy N/A 08/14/2014    Procedure: WIDE LOCAL EXCISION VULVA;  Surgeon: Adolphus BirchwoodEmma Rossi, MD;  [facility];  Service: Gynecology;  Laterality: N/A;   Family History  Problem Relation Age of Onset  . Diabetes Paternal Aunt   . Charcot-Marie-Tooth disease Maternal Grandmother   . Heart disease Maternal Grandfather   . AAA (abdominal aortic aneurysm) Maternal Grandfather   . Glaucoma Maternal Grandfather   . Diabetes Paternal Grandmother   . Prostate cancer Paternal Grandfather   . Diabetes Paternal Grandfather    Social History  Substance Use Topics  . Smoking status: Never Smoker   . Smokeless tobacco: Never Used  . Alcohol Use: 1.0 oz/week    2 Standard drinks or equivalent per week     Comment: social    OB History    No data available     Review of Systems  Eyes: Negative for double vision and photophobia.  All other systems reviewed and are negative.     Allergies  Review of patient's allergies indicates no known allergies.  Home Medications   Prior to Admission medications   Medication Sig Start Date End Date Taking? Authorizing Provider  citalopram (CELEXA) 40 MG tablet Take 40 mg by mouth every morning.    Yes Historical Provider, MD  conjugated estrogens (PREMARIN) vaginal cream Place 1 Applicatorful vaginally daily. Use externally as directed. 08/23/14  Yes Cleda Mccreedy, MD  dicyclomine (BENTYL) 10 MG capsule Take 10 mg by mouth 2 (two) times daily.  09/28/14  Yes Historical Provider, MD  HYDROcodone-acetaminophen (NORCO/VICODIN) 5-325 MG per tablet Take 1 tablet by mouth every 6 (six) hours as needed for moderate pain (for cluster headaches).   Yes Historical Provider, MD  ibuprofen (ADVIL,MOTRIN) 800 MG tablet Take 1 tablet (800 mg total) by mouth every 8 (eight) hours as needed. 08/14/14  Yes Adolphus Birchwood, MD  lisdexamfetamine (VYVANSE) 70 MG  capsule Take 70 mg by mouth every morning.   Yes Historical Provider, MD  meloxicam (MOBIC) 15 MG tablet Take 1 tablet (15 mg total) by mouth daily. 11/03/14  Yes Lenn Sink, DPM  Multiple Vitamins-Minerals (HM MULTIVITAMIN ADULT GUMMY) CHEW Chew 1 tablet by mouth daily.   Yes Historical Provider, MD  norethindrone-ethinyl estradiol 1/35 (ORTHO-NOVUM, NORTREL,CYCLAFEM) tablet Take 1 tablet by mouth every morning.     Historical Provider, MD  pantoprazole (PROTONIX) 40 MG tablet Take 40 mg by mouth every morning.     Historical Provider, MD  ranitidine (ZANTAC) 300 MG tablet Take 300 mg by mouth at bedtime.    Historical Provider, MD  silver sulfADIAZINE (SILVADENE) 1 % cream Apply 1 application topically 2 (two) times daily. 09/01/14   De Blanch, MD  SUMAtriptan (IMITREX) 25 MG tablet Take 25 mg by mouth once as needed for migraine. May repeat in 2 hours if headache persists or recurs.    Historical Provider, MD  traZODone (DESYREL) 50 MG tablet Take 100 mg by mouth at bedtime.     Historical Provider, MD  valACYclovir (VALTREX) 500 MG tablet Take 500 mg by mouth every morning.     Historical Provider, MD   BP 137/72 mmHg  Pulse 83  Temp(Src) 98 F (36.7 C) (Oral)  Resp 16  SpO2 99%  LMP 11/05/2014 Physical Exam  Constitutional: She is oriented to person, place, and time. She appears well-developed and well-nourished. No distress.  HENT:  Head: Normocephalic.  Eyes: Conjunctivae are normal. Pupils are equal, round, and reactive to light. Right conjunctiva is not injected. Left conjunctiva is not injected. Right eye exhibits normal extraocular motion. Left eye exhibits normal extraocular motion.  Fundoscopic exam:      The right eye shows no arteriolar narrowing, no AV nicking, no hemorrhage and no papilledema.  Slit lamp exam:      The right eye shows no corneal ulcer and no fluorescein uptake.       The left eye shows no corneal ulcer and no fluorescein uptake.  Neck: Neck  supple. No tracheal deviation present.  Cardiovascular: Normal rate and regular rhythm.   Pulmonary/Chest: Effort normal. No respiratory distress.  Abdominal: Soft. She exhibits no distension.  Neurological: She is alert and oriented to person, place, and time.  Skin: Skin is warm and dry.  Psychiatric: She has a normal mood and affect.    ED Course  Procedures (including critical care time)  Emergency Focused Ultrasound Exam Limited Ocular Ultrasound   Performed and interpreted by Dr. Clydene Pugh Indication: loss of vision  Linear probe used to scan in two planes to image right eye(s) anterior and posterior chambers with measurement of optic nerve sheath at 3mm depth. Findings: no flap of tissue, no shadowing, no widening of optic nerve sheath Interpretation: no retinal detachment, no foreign body, no increased ICP, lens normal Images archived electronically.  CPT codes: 45409 (diagnostic ocular ultrasound)  Labs Review Labs Reviewed - No data to display  Imaging Review No results found. I have personally reviewed and evaluated these images and lab results as part of my medical decision-making.   EKG Interpretation None      MDM   Final diagnoses:  Dim vision of right eye    29 year old female presents with "darkening" of her vision that occurs intermittently in low light conditions. Her vision is darker in her right eye than left eye. She has had this occasionally but has had increased frequency over the last week with 2 episodes in the last 48 hours. Also complains of mild bilateral foreign body sensation. No corneal ulcer, abrasion, foreign body noted on examination before seen. Ocular ultrasound at bedside demonstrates no evidence of retinal detachment or other structural cause of vision loss. No indication for emergent ophthalmologic consult at this time with lack of current visual symptoms or loss of visual field but I recommended the patient follow-up closely with  ophthalmology to evaluate the retina formally under direct visualization. Plan to follow up with PCP as needed and return precautions discussed for worsening or new concerning symptoms.     Lyndal Pulley, MD 11/19/14 0500

## 2014-11-28 ENCOUNTER — Other Ambulatory Visit (HOSPITAL_COMMUNITY): Payer: Self-pay | Admitting: Family Medicine

## 2014-11-28 ENCOUNTER — Ambulatory Visit (HOSPITAL_COMMUNITY)
Admission: RE | Admit: 2014-11-28 | Discharge: 2014-11-28 | Disposition: A | Discharge status: Home / Self Care | Payer: 59 | Source: Ambulatory Visit | Attending: Cardiovascular Disease | Admitting: Cardiovascular Disease

## 2014-11-28 DIAGNOSIS — G453 Amaurosis fugax: Secondary | ICD-10-CM

## 2014-11-28 DIAGNOSIS — H539 Unspecified visual disturbance: Secondary | ICD-10-CM | POA: Insufficient documentation

## 2014-11-29 ENCOUNTER — Ambulatory Visit (HOSPITAL_COMMUNITY): Payer: 59 | Attending: Cardiology

## 2014-11-29 ENCOUNTER — Other Ambulatory Visit: Payer: Self-pay

## 2014-11-29 DIAGNOSIS — I340 Nonrheumatic mitral (valve) insufficiency: Secondary | ICD-10-CM | POA: Diagnosis not present

## 2014-11-29 DIAGNOSIS — G453 Amaurosis fugax: Secondary | ICD-10-CM

## 2014-12-04 DIAGNOSIS — M779 Enthesopathy, unspecified: Secondary | ICD-10-CM

## 2014-12-08 ENCOUNTER — Ambulatory Visit: Payer: 59 | Attending: Family Medicine | Admitting: Physical Therapy

## 2014-12-08 DIAGNOSIS — R262 Difficulty in walking, not elsewhere classified: Secondary | ICD-10-CM | POA: Diagnosis present

## 2014-12-08 DIAGNOSIS — M25572 Pain in left ankle and joints of left foot: Secondary | ICD-10-CM | POA: Insufficient documentation

## 2014-12-08 DIAGNOSIS — G729 Myopathy, unspecified: Secondary | ICD-10-CM | POA: Diagnosis present

## 2014-12-08 DIAGNOSIS — M6289 Other specified disorders of muscle: Secondary | ICD-10-CM

## 2014-12-08 NOTE — Patient Instructions (Addendum)
Hamstring Step 2    Left foot relaxed, knee straight, other leg bent, foot flat. Raise straight leg further upward to maximal range. Hold _30_ seconds. Relax leg completely down. Repeat _3__ times.  Copyright  VHI. All rights reserved.    Achilles Tendon Stretch    Stand with hands supported on wall, elbows slightly bent, feet parallel and both heels on floor, front knee bent, back knee straight. Slowly relax back knee until a stretch is felt in achilles tendon. Hold __30__ seconds. Repeat with leg positions switched.  Copyright  VHI. All rights reserved.    Gastroc / Heel Cord Stretch - On Step    Stand with heels over edge of stair. Holding rail, lower heels until stretch is felt in calf of legs. Repeat _3__ times. Do _1-2__ times per day.  Copyright  VHI. All rights reserved.    IONTOPHORESIS PATIENT PRECAUTIONS & CONTRAINDICATIONS:  . Redness under one or both electrodes can occur.  This characterized by a uniform redness that usually disappears within 12 hours of treatment. . Small pinhead size blisters may result in response to the drug.  Contact your physician if the problem persists more than 24 hours. . On rare occasions, iontophoresis therapy can result in temporary skin reactions such as rash, inflammation, irritation or burns.  The skin reactions may be the result of individual sensitivity to the ionic solution used, the condition of the skin at the start of treatment, reaction to the materials in the electrodes, allergies or sensitivity to dexamethasone, or a poor connection between the patch and your skin.  Discontinue using iontophoresis if you have any of these reactions and report to your therapist. . Remove the Patch or electrodes if you have any undue sensation of pain or burning during the treatment and report discomfort to your therapist. . Tell your Therapist if you have had known adverse reactions to the application of electrical current. . If using the  Patch, the LED light will turn off when treatment is complete and the patch can be removed.  Approximate treatment time is 1-3 hours.  Remove the patch when light goes off or after 6 hours. . The Patch can be worn during normal activity, however excessive motion where the electrodes have been placed can cause poor contact between the skin and the electrode or uneven electrical current resulting in greater risk of skin irritation. Marland Kitchen. Keep out of the reach of children.   . DO NOT use if you have a cardiac pacemaker or any other electrically sensitive implanted device. . DO NOT use if you have a known sensitivity to dexamethasone. . DO NOT use during Magnetic Resonance Imaging (MRI). . DO NOT use over broken or compromised skin (e.g. sunburn, cuts, or acne) due to the increased risk of skin reaction. . DO NOT SHAVE over the area to be treated:  To establish good contact between the Patch and the skin, excessive hair may be clipped. . DO NOT place the Patch or electrodes on or over your eyes, directly over your heart, or brain. . DO NOT reuse the Patch or electrodes as this may cause burns to occur.

## 2014-12-08 NOTE — Therapy (Signed)
[place], [place], [address] Phone: [phone]   Fax:  [phone]  Physical Therapy Evaluation  Patient Details  Name: Holly Lynch MRN: [id number] Date of Birth: 1985/10/06 Referring Provider: Donnie Mesa  Encounter Date: 12/08/2014      PT End of Session - 12/08/14 1115    Visit Number 1   Number of Visits 16   Date for PT Re-Evaluation 02/02/15   PT Start Time 9563   PT Stop Time 1103   PT Time Calculation (min) 45 min   Activity Tolerance Patient tolerated treatment well   Behavior During Therapy [facility] for tasks assessed/performed      Past Medical History  Diagnosis Date  . Insomnia   . Anxiety   . ADD (attention deficit disorder)   . History of esophagitis     grade b  . GERD (gastroesophageal reflux disease)   . Mass of labium   . History of condyloma acuminatum     vulva  . Genital herpes simplex type 2   . Hyperkeratosis     RIGHT ANTERIOR VULVA  . Cluster headaches   . Frequency of urination   . Wears contact lenses   . Hiatal hernia 08/31/14    colonoscopy done 8/11 by Dr. Paulita Fujita    Past Surgical History  Procedure Laterality Date  . Colonoscopy  08/31/14    Dr. Paulita Fujita  . Metatarsal osteotomy with bunionectomy Bilateral left 05-16-2014//   right 12/ 2014  . Laser ablation condolamata N/A 09/28/2013    Procedure: CO2 LASER ABLATION CONDYLOMA;  Surgeon: Thurnell Lose, MD;  Location: [facility];  Service: Gynecology;  Laterality: N/A;  . Esophageal biopsy N/A 09/28/2013    Procedure: VULVAR BIOPSY;  Surgeon: Thurnell Lose, MD;  Location: [facility];  Service: Gynecology;  Laterality: N/A;  . Wisdom tooth extraction  1998  . Tonsillectomy  2000  . Negative sleep study  12-14-2010  . Vulvectomy N/A 08/14/2014    Procedure: WIDE LOCAL EXCISION VULVA;  Surgeon: Everitt Amber, MD;  Location: [facility];  Service: Gynecology;  Laterality: N/A;    There were no vitals filed for this  visit.  Visit Diagnosis:  Muscle tightness  Pain in joint, ankle and foot, left  Walking difficulty due to ankle and foot      Subjective Assessment - 12/08/14 1022    Subjective Patient presents with L foot pain (4th, 5th met), difficulty walking, working.  Works full time at [employer].  Reports walking on her toes as a young child.  Has orthotics.    Pertinent History 2014 Rt. big toe bunionectomy, 05/16/14 L big and 4th bunion.  Done by Paulla Dolly, 07/2014 stress fx 2 nd Met.    Currently in Pain? Yes   Pain Score 3    Pain Location Foot   Pain Orientation Left;Distal;Lateral  met pads   Pain Descriptors / Indicators Pressure;Aching   Pain Type Chronic pain   Pain Onset More than a month ago   Pain Frequency Constant   Aggravating Factors  walking, weightbearing    Pain Relieving Factors rest, ice, pain meds (Mobic)   Effect of Pain on Daily Activities pain is continuous with standing, walking    Multiple Pain Sites No            [facility] PT Assessment - 12/08/14 1028    Assessment   Medical Diagnosis L foot pain    Referring Provider Donnie Mesa   Functional Tests  Functional tests Single leg stance   Single Leg Stance   Comments decr 10 sec on LLE   Posture/Postural Control   Posture/Postural Control Postural limitations   Postural Limitations Anterior pelvic tilt   Posture Comments genu recurvatum and pes planus on LLE   AROM   Right Ankle Dorsiflexion 2   Right Ankle Plantar Flexion 70   Right Ankle Inversion 22  30 PROM   Right Ankle Eversion 12   Left Ankle Dorsiflexion 0   Left Ankle Plantar Flexion 72   Left Ankle Inversion 30  40 PROM   Left Ankle Eversion 20   Strength   Right Knee Flexion 5/5   Right Knee Extension 5/5   Left Knee Flexion 5/5   Left Knee Extension 5/5   Right Ankle Dorsiflexion 5/5   Right Ankle Plantar Flexion 5/5   Right Ankle Inversion 5/5   Right Ankle Eversion 4/5   Left Ankle Dorsiflexion 5/5   Left Ankle Plantar Flexion 4+/5   Left  Ankle Inversion 4/5   Left Ankle Eversion 4/5   Flexibility   Hamstrings 90/90 L 28 deg, RT. 33 deg    Palpation   Palpation comment pain just below met heads 4, 5 on L foot   Balance   Balance Assessed Yes   Static Standing Balance   Static Standing - Level of Assistance --  Rt LE 30 sec, wobbly, LT. LE 10 sec                PT Education - 12/08/14 1114    Education provided Yes   Education Details PT/POC, HEP, iontophoresis and orthotics   Person(s) Educated Patient   Methods Explanation;Demonstration;Handout   Comprehension Verbalized understanding;Returned demonstration          PT Short Term Goals - 12/08/14 1125    PT SHORT TERM GOAL #1   Title Pt will be I with initial HEP    Time 4   Period Weeks   Status New   PT SHORT TERM GOAL #2   Title Pt will report less pain/discomfort in foot at rest (<2/10)   Time 4   Period Weeks   Status New           PT Long Term Goals - 12/08/14 1128    PT LONG TERM GOAL #1   Title Pt will be a advanced HEP for LEs   Time 8   Period Weeks   Status New   PT LONG TERM GOAL #2   Title Pt will be able to stand/walk at work for up to 4 hours at a time with pain </=3/10 in LLE    Time 8   Period Weeks   Status New   PT LONG TERM GOAL #3   Title Pt will be able to balance in LLE for 15 sec at a time or more for dynamic balance activity.    Time 8   Period Weeks   Status New   PT LONG TERM GOAL #4   Title Pt will report understanding of posture and LE alignment and how it contributes to joint pain   Time 8   Period Weeks   Status New               Plan - 12/08/14 1117    Clinical Impression Statement Patient with chronic foot, LE issues. She went to [facility] for a 2nd opinion, assessment from MD attributes pain to a change in her gait, tightness in bilateral  achilles.  She is frustrated with the fact that since her 2nd surgery she has pain which she states she did not have prior. She has modified her footwear  and is trying to maintain her weight .     Pt will benefit from skilled therapeutic intervention in order to improve on the following deficits Decreased balance;Impaired flexibility;Postural dysfunction;Decreased strength;Pain;Increased fascial restricitons;Decreased range of motion;Difficulty walking   Rehab Potential Good   PT Frequency 2x / week   PT Duration 8 weeks   PT Treatment/Interventions Ultrasound;Neuromuscular re-education;Passive range of motion;Dry needling;Orthotic Fit/Training;Functional mobility training;Cryotherapy;Iontophoresis 9m/ml Dexamethasone;Moist Heat;Balance training;Therapeutic exercise;Manual techniques;Therapeutic activities   PT Next Visit Plan Manual, [place] assess ionto and cont stretching    PT Home Exercise Plan gastroc stretching, hamstring   Consulted and Agree with Plan of Care Patient         Problem List Patient Active Problem List   Diagnosis Date Noted  . 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 Hyperkeratosis of skin 07/31/2014    Jiovanna Frei 12/08/2014, 11:36 AM  [facility] [place] [address]Phone: [phone]  Fax:  [phone] Name: FDAMARYS SPEIRMRN: [id number] of Birth: [birth year] JRaeford Razor PT 12/08/2014 11:36 AM Phone: [phone]: [phone]

## 2014-12-11 ENCOUNTER — Ambulatory Visit: Payer: 59 | Admitting: Physical Therapy

## 2014-12-11 DIAGNOSIS — G729 Myopathy, unspecified: Secondary | ICD-10-CM | POA: Diagnosis not present

## 2014-12-11 DIAGNOSIS — R262 Difficulty in walking, not elsewhere classified: Secondary | ICD-10-CM

## 2014-12-11 DIAGNOSIS — M25572 Pain in left ankle and joints of left foot: Secondary | ICD-10-CM

## 2014-12-11 DIAGNOSIS — M6289 Other specified disorders of muscle: Secondary | ICD-10-CM

## 2014-12-11 NOTE — Therapy (Signed)
[place], [place], [address] Phone: [phone]   Fax:  [phone]  Physical Therapy Treatment  Patient Details  Name: Holly Lynch MRN: [id number] Date of Birth: [birth year] Referring Provider: Donnie Mesa  Encounter Date: 12/11/2014      PT End of Session - 12/11/14 0857    Visit Number 2   Number of Visits 16   Date for PT Re-Evaluation 02/02/15   PT Start Time 0733   PT Stop Time 0810   PT Time Calculation (min) 37 min   Activity Tolerance Patient tolerated treatment well;No increased pain   Behavior During Therapy [facility] for tasks assessed/performed      Past Medical History  Diagnosis Date  . Insomnia   . Anxiety   . ADD (attention deficit disorder)   . History of esophagitis     grade b  . GERD (gastroesophageal reflux disease)   . Mass of labium   . History of condyloma acuminatum     vulva  . Genital herpes simplex type 2   . Hyperkeratosis     RIGHT ANTERIOR VULVA  . Cluster headaches   . Frequency of urination   . Wears contact lenses   . Hiatal hernia 08/31/14    colonoscopy done 8/11 by Dr. Paulita Fujita    Past Surgical History  Procedure Laterality Date  . Colonoscopy  08/31/14    Dr. Paulita Fujita  . Metatarsal osteotomy with bunionectomy Bilateral left 05-16-2014//   right 12/ 2014  . Laser ablation condolamata N/A 09/28/2013    Procedure: CO2 LASER ABLATION CONDYLOMA;  Surgeon: Thurnell Lose, MD;  Location: [facility];  Service: Gynecology;  Laterality: N/A;  . Esophageal biopsy N/A 09/28/2013    Procedure: VULVAR BIOPSY;  Surgeon: Thurnell Lose, MD;  Location: [facility];  Service: Gynecology;  Laterality: N/A;  . Wisdom tooth extraction  1998  . Tonsillectomy  2000  . Negative sleep study  12-14-2010  . Vulvectomy N/A 08/14/2014    Procedure: WIDE LOCAL EXCISION VULVA;  Surgeon: Everitt Amber, MD;  Location: [facility];  Service: Gynecology;  Laterality: N/A;    There were no  vitals filed for this visit.  Visit Diagnosis:  Muscle tightness  Pain in joint, ankle and foot, left  Walking difficulty due to ankle and foot      Subjective Assessment - 12/11/14 0848    Subjective Able to rest over the weekend.  Pain not quite as bad 1st thin in am.     Currently in Pain? Yes   Pain Score --  mild   Pain Orientation Right;Left   Pain Descriptors / Indicators Aching;Pressure   Pain Frequency Constant   Aggravating Factors  increased time up on feet.     Pain Relieving Factors insoles   Multiple Pain Sites No                         [facility] Adult PT Treatment/Exercise - 12/11/14 0733    Modalities   Modalities Ultrasound   Ultrasound   Ultrasound Location achilles, met heads.   Ultrasound Parameters 50%  met heads, 100 % achilles  total 8 minutes   Ultrasound Goals Pain  flexibility   Iontophoresis   Type of Iontophoresis Dexamethasone   Location Met heads   Dose 4 mg/ml 1 cc.  5 min. 6 hour patch   Manual Therapy   Manual therapy comments Rock blade ,, for soft tissue work Horticulturist, commercial,  soleus RT                  PT Short Term Goals - 12/11/14 0859    PT SHORT TERM GOAL #1   Time 4   Period Weeks   Status Unable to assess   PT SHORT TERM GOAL #2   Title Pt will report less pain/discomfort in foot at rest (<2/10)   Time 4   Period Weeks   Status On-going           PT Long Term Goals - 12/08/14 1128    PT LONG TERM GOAL #1   Title Pt will be a advanced HEP for LEs   Time 8   Period Weeks   Status New   PT LONG TERM GOAL #2   Title Pt will be able to stand/walk at work for up to 4 hours at a time with pain </=3/10 in LLE    Time 8   Period Weeks   Status New   PT LONG TERM GOAL #3   Title Pt will be able to balance in LLE for 15 sec at a time or more for dynamic balance activity.    Time 8   Period Weeks   Status New   PT LONG TERM GOAL #4   Title Pt will report understanding of posture and LE alignment and  how it contributes to joint pain   Time 8   Period Weeks   Status New               Plan - 12/11/14 9678    Clinical Impression Statement beginning care for foot pain.  Trial ionto met heads vs gastroc.  She will stretch at gym.   PT Next Visit Plan Manual, [place],  ionto and cont stretching    PT Home Exercise Plan gastroc stretching, hamstring        Problem List Patient Active Problem List   Diagnosis Date Noted  . 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 Hyperkeratosis of skin 07/31/2014    [facility] 12/11/2014, 12:49 PM  [facility] [address] [place], [place], [address] Phone: [phone]   Fax:  [phone]  Name: Holly Lynch MRN: [id number] Date of Birth: Jan 24, 1985    Melvenia Needles, PTA 12/11/2014 12:49 PM Phone: [phone] Fax: [phone]

## 2014-12-11 NOTE — Patient Instructions (Signed)
Stretching instructions 3 reps, gentle stretch, 30 seconds duration, gastroc, soleus, hamstrings.

## 2014-12-18 ENCOUNTER — Ambulatory Visit: Payer: 59 | Admitting: Physical Therapy

## 2014-12-22 ENCOUNTER — Ambulatory Visit: Payer: 59 | Attending: Family Medicine | Admitting: Physical Therapy

## 2014-12-22 DIAGNOSIS — M25572 Pain in left ankle and joints of left foot: Secondary | ICD-10-CM | POA: Insufficient documentation

## 2014-12-22 DIAGNOSIS — G729 Myopathy, unspecified: Secondary | ICD-10-CM | POA: Insufficient documentation

## 2014-12-22 DIAGNOSIS — R262 Difficulty in walking, not elsewhere classified: Secondary | ICD-10-CM | POA: Diagnosis present

## 2014-12-22 DIAGNOSIS — M6289 Other specified disorders of muscle: Secondary | ICD-10-CM

## 2014-12-22 NOTE — Therapy (Signed)
[place], [place], [address] Phone: [phone]   Fax:  [phone]  Physical Therapy Treatment  Patient Details  Name: Holly Lynch MRN: [id number] Date of Birth: 1985/03/18 Referring Provider: Donnie Mesa  Encounter Date: 12/22/2014      PT End of Session - 12/22/14 1202    Visit Number 3   Number of Visits 16   Date for PT Re-Evaluation 02/02/15   PT Start Time 1117   PT Stop Time 1100   PT Time Calculation (min) 45 min   Activity Tolerance Patient tolerated treatment well   Behavior During Therapy [facility] for tasks assessed/performed      Past Medical History  Diagnosis Date  . Insomnia   . Anxiety   . ADD (attention deficit disorder)   . History of esophagitis     grade b  . GERD (gastroesophageal reflux disease)   . Mass of labium   . History of condyloma acuminatum     vulva  . Genital herpes simplex type 2   . Hyperkeratosis     RIGHT ANTERIOR VULVA  . Cluster headaches   . Frequency of urination   . Wears contact lenses   . Hiatal hernia 08/31/14    colonoscopy done 8/11 by Dr. Paulita Fujita    Past Surgical History  Procedure Laterality Date  . Colonoscopy  08/31/14    Dr. Paulita Fujita  . Metatarsal osteotomy with bunionectomy Bilateral left 05-16-2014//   right 12/ 2014  . Laser ablation condolamata N/A 09/28/2013    Procedure: CO2 LASER ABLATION CONDYLOMA;  Surgeon: Thurnell Lose, MD;  Location: [facility];  Service: Gynecology;  Laterality: N/A;  . Esophageal biopsy N/A 09/28/2013    Procedure: VULVAR BIOPSY;  Surgeon: Thurnell Lose, MD;  Location: [facility];  Service: Gynecology;  Laterality: N/A;  . Wisdom tooth extraction  1998  . Tonsillectomy  2000  . Negative sleep study  12-14-2010  . Vulvectomy N/A 08/14/2014    Procedure: WIDE LOCAL EXCISION VULVA;  Surgeon: Everitt Amber, MD;  Location: [facility];  Service: Gynecology;  Laterality: N/A;    There were no vitals filed for this  visit.  Visit Diagnosis:  Muscle tightness  Walking difficulty due to ankle and foot  Pain in joint, ankle and foot, left      Subjective Assessment - 12/22/14 1011    Subjective pt reports that her orthotics isn't helping as much as she would like it to.    Currently in Pain? Yes   Pain Score 2    Pain Location Foot   Pain Orientation Right;Left   Pain Descriptors / Indicators Aching   Pain Type Chronic pain   Pain Onset More than a month ago   Pain Frequency Intermittent   Aggravating Factors  prolonged standing, walking    Pain Relieving Factors insoles                         [facility] Adult PT Treatment/Exercise - 12/22/14 0001    Iontophoresis   Type of Iontophoresis Dexamethasone   Location Met heads   Dose 4 mg/ml 1 cc.  x 2   Time 6 hours   Manual Therapy   Manual therapy comments instrument assisted STM over the medial and lateral gastroc/ soleus while sustained stretch with pt in prone.    Ankle Exercises: Stretches   Soleus Stretch 2 reps;30 seconds   Gastroc Stretch 2 reps;30 seconds  Trigger Point Dry Needling - 12/22/14 1158    Consent Given? Yes   Education Handout Provided Yes   Muscles Treated Lower Body Soleus;Gastrocnemius   Gastrocnemius Response Twitch response elicited;Palpable increased muscle length   Soleus Response Twitch response elicited;Palpable increased muscle length              PT Education - 12/22/14 1202    Education provided Yes   Education Details dry needling education   Person(s) Educated Patient   Methods Explanation   Comprehension Verbalized understanding          PT Short Term Goals - 12/22/14 1206    PT SHORT TERM GOAL #1   Title Pt will be I with initial HEP    Time 4   Period Weeks   Status Achieved   PT SHORT TERM GOAL #2   Title Pt will report less pain/discomfort in foot at rest (<2/10)   Time 4   Period Weeks   Status On-going           PT Long Term Goals -  12/08/14 1128    PT LONG TERM GOAL #1   Title Pt will be a advanced HEP for LEs   Time 8   Period Weeks   Status New   PT LONG TERM GOAL #2   Title Pt will be able to stand/walk at work for up to 4 hours at a time with pain </=3/10 in LLE    Time 8   Period Weeks   Status New   PT LONG TERM GOAL #3   Title Pt will be able to balance in LLE for 15 sec at a time or more for dynamic balance activity.    Time 8   Period Weeks   Status New   PT LONG TERM GOAL #4   Title Pt will report understanding of posture and LE alignment and how it contributes to joint pain   Time 8   Period Weeks   Status New               Plan - 12/22/14 1202    Clinical Impression Statement pt reports that it is difficult to tell if the ionto has helped in her foot or not. Educated about dry needling and pt was interested and provided consent for dry needling of the bil gastroc/ soleus with multiple twitches elicited and relief of tension noted. She reported relief with stretching and with the instrument assisted STM.    PT Next Visit Plan Manual, [place],  ionto and cont stretching, assess response to dry needling.    PT Home Exercise Plan dry needling education        Problem List Patient Active Problem List   Diagnosis Date Noted  . 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 Hyperkeratosis of skin 07/31/2014   Starr Lake PT, DPT, LAT, ATC  12/22/2014  12:07 PM    [facility] [address] [place], [place], [address] Phone: [phone]   Fax:  [phone]  Name: Holly Lynch MRN: [id number] Date of Birth: 15-Sep-1985

## 2014-12-25 ENCOUNTER — Ambulatory Visit: Payer: 59 | Admitting: Physical Therapy

## 2014-12-25 DIAGNOSIS — M6289 Other specified disorders of muscle: Secondary | ICD-10-CM

## 2014-12-25 DIAGNOSIS — R262 Difficulty in walking, not elsewhere classified: Secondary | ICD-10-CM

## 2014-12-25 DIAGNOSIS — G729 Myopathy, unspecified: Secondary | ICD-10-CM | POA: Diagnosis not present

## 2014-12-25 DIAGNOSIS — M25572 Pain in left ankle and joints of left foot: Secondary | ICD-10-CM

## 2014-12-25 NOTE — Patient Instructions (Signed)
Hamstring Stretch, Reclined (Strap, Doorframe)    Lengthen bottom leg on floor. Extend top leg along edge of doorframe or press foot up into yoga strap. Hold for _2-3___ breaths. Repeat _2-3___ times each leg. CROSS LEG OVER MIDLINE TO GET ITB  Copyright  VHI. All rights reserved.   Hip Flexion / Knee Extension: Straight-Leg Raise (Eccentric)    Lie on back. Lift leg with knee straight. Slowly lower leg for 3-5 seconds. KEEP TOES TURNED OUT _10__ reps per set, __2_ sets per day, __5_ days per week. Lower like elevator, stopping at each floor.   Copyright  VHI. All rights reserved.

## 2014-12-25 NOTE — Therapy (Signed)
[facility] [address] [place], [place], [address] Phone: [phone]   Fax:  [phone]  Physical Therapy Treatment  Patient Details  Name: Holly Lynch MRN: [id number] Date of Birth: [birth year] Referring Provider: Eilleen Kempf  Encounter Date: 12/25/2014      PT End of Session - 12/25/14 1341    Visit Number 4   Number of Visits 16   Date for PT Re-Evaluation 02/02/15   PT Start Time 1330   PT Stop Time 1423   PT Time Calculation (min) 53 min   Activity Tolerance Patient tolerated treatment well   Behavior During Therapy [facility] for tasks assessed/performed      Past Medical History  Diagnosis Date  . Insomnia   . Anxiety   . ADD (attention deficit disorder)   . History of esophagitis     grade b  . GERD (gastroesophageal reflux disease)   . Mass of labium   . History of condyloma acuminatum     vulva  . Genital herpes simplex type 2   . Hyperkeratosis     RIGHT ANTERIOR VULVA  . Cluster headaches   . Frequency of urination   . Wears contact lenses   . Hiatal hernia 08/31/14    colonoscopy done 8/11 by Dr. Dulce Sellar    Past Surgical History  Procedure Laterality Date  . Colonoscopy  08/31/14    Dr. Dulce Sellar  . Metatarsal osteotomy with bunionectomy Bilateral left 05-16-2014//   right 12/ 2014  . Laser ablation condolamata N/A 09/28/2013    Procedure: CO2 LASER ABLATION CONDYLOMA;  Surgeon: Geryl Rankins, MD;  Location: [facility];  Service: Gynecology;  Laterality: N/A;  . Esophageal biopsy N/A 09/28/2013    Procedure: VULVAR BIOPSY;  Surgeon: Geryl Rankins, MD;  Location: [facility];  Service: Gynecology;  Laterality: N/A;  . Wisdom tooth extraction  1998  . Tonsillectomy  2000  . Negative sleep study  12-14-2010  . Vulvectomy N/A 08/14/2014    Procedure: WIDE LOCAL EXCISION VULVA;  Surgeon: Adolphus Birchwood, MD;  Location: [facility];  Service: Gynecology;  Laterality: N/A;    There were no vitals filed for this  visit.  Visit Diagnosis:  Muscle tightness  Walking difficulty due to ankle and foot  Pain in joint, ankle and foot, left      Subjective Assessment - 12/25/14 1335    Subjective Dry needling helped, but hasnt worked since then.  Has been doing stretching.  Took out the smaller orthotic, it was smushed and not doing anything. Gets Rolfed 2 times per yr.    Currently in Pain? Yes   Pain Score 1    Pain Location Foot   Pain Orientation Left   Pain Descriptors / Indicators Aching   Pain Type Chronic pain   Pain Onset More than a month ago   Pain Frequency Intermittent           [facility] Adult PT Treatment/Exercise - 12/25/14 2056    Knee/Hip Exercises: Stretches   Active Hamstring Stretch Both;1 rep;30 seconds   Other Knee/Hip Stretches ITB with strap bilateral 30 sec x 2    Knee/Hip Exercises: Supine   Quad Sets Strengthening;Both;1 set   Quad Sets Limitations palpate VMO    Straight Leg Raise with External Rotation Strengthening;Both;2 sets;10 reps   Straight Leg Raise with External Rotation Limitations diff maintaining full knee ext on Rt.    Manual Therapy   Manual Therapy Soft tissue mobilization;Myofascial release   Soft  tissue mobilization bilateral gastroc/soleus, worked plantar fascia with prone sustained stretching    Myofascial Release calf, post.  tolerated deep pressure with lengthening strokes and trigger point work      Landilates Reformer used for LE/core strength, postural strength, lumbopelvic disassociation and core control.  Exercises included: Footwork 2 Red and 1 Blue on heels, arch and ball of foot, neutral and ER  Worked on LE alignment   Used ball to correct supination and genu valgus  Prolonged stretching to gastroc  Single leg press out 2 Red, when pt corrects ankle, knee falls out laterally           PT Education - 12/25/14 2049    Education provided Yes   Education Details Pilates education and LE alignment   Person(s) Educated Patient    Methods Explanation   Comprehension Verbalized understanding;Need further instruction          PT Short Term Goals - 12/22/14 1206    PT SHORT TERM GOAL #1   Title Pt will be I with initial HEP    Time 4   Period Weeks   Status Achieved   PT SHORT TERM GOAL #2   Title Pt will report less pain/discomfort in foot at rest (<2/10)   Time 4   Period Weeks   Status On-going           PT Long Term Goals - 12/08/14 1128    PT LONG TERM GOAL #1   Title Pt will be a advanced HEP for LEs   Time 8   Period Weeks   Status New   PT LONG TERM GOAL #2   Title Pt will be able to stand/walk at work for up to 4 hours at a time with pain </=3/10 in LLE    Time 8   Period Weeks   Status New   PT LONG TERM GOAL #3   Title Pt will be able to balance in LLE for 15 sec at a time or more for dynamic balance activity.    Time 8   Period Weeks   Status New   PT LONG TERM GOAL #4   Title Pt will report understanding of posture and LE alignment and how it contributes to joint pain   Time 8   Period Weeks   Status New               Plan - 12/25/14 2051    Clinical Impression Statement Worked on strengthening today and correcting the stiffness in bilateral feet, ankles and genu valgus/recurvatum.  She had difficulty maintaining quad contraction and PF without collapsing into hyperext of knees, supinates unless corrected. Tight ITB.  VMO slightly weaker on Rt.    PT Next Visit Plan Give her Sherre PootGreg Elliiot LMT contact info Manual, US,  ionto and cont stretching/standing on rockerboard/foam etc.    PT Home Exercise Plan stand achilles, supine ITB and hamstring, step stretch to calf   Consulted and Agree with Plan of Care Patient        Problem List Patient Active Problem List   Diagnosis Date Noted  . 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 Hyperkeratosis of skin 07/31/2014    Holly Lynch 12/25/2014, 9:01 PM  [facility] [address] [place], [place], [address] Phone: [phone]   Fax:  [phone]  Name: Holly Lynch MRN: [id number] Date of Birth: Feb 19, 1985   Karie MainlandJennifer Aydin Hink, PT 12/25/2014 9:03 PM Phone: [phone] Fax: [phone]

## 2014-12-26 ENCOUNTER — Ambulatory Visit: Payer: 59 | Admitting: Physical Therapy

## 2014-12-26 DIAGNOSIS — R262 Difficulty in walking, not elsewhere classified: Secondary | ICD-10-CM

## 2014-12-26 DIAGNOSIS — G729 Myopathy, unspecified: Secondary | ICD-10-CM | POA: Diagnosis not present

## 2014-12-26 DIAGNOSIS — M6289 Other specified disorders of muscle: Secondary | ICD-10-CM

## 2014-12-26 DIAGNOSIS — M25572 Pain in left ankle and joints of left foot: Secondary | ICD-10-CM

## 2014-12-26 NOTE — Therapy (Signed)
[place], [place], [address] Phone: [phone]   Fax:  [phone]  Physical Therapy Treatment  Patient Details  Name: Holly Lynch MRN: [id number] Date of Birth: May 13, 1985 Referring Provider: Donnie Mesa  Encounter Date: 12/26/2014      PT End of Session - 12/26/14 1021    Visit Number 5   Number of Visits 16   Date for PT Re-Evaluation 02/02/15   PT Start Time 1016   PT Stop Time 1059   PT Time Calculation (min) 43 min   Activity Tolerance Patient tolerated treatment well   Behavior During Therapy [facility] for tasks assessed/performed      Past Medical History  Diagnosis Date  . Insomnia   . Anxiety   . ADD (attention deficit disorder)   . History of esophagitis     grade b  . GERD (gastroesophageal reflux disease)   . Mass of labium   . History of condyloma acuminatum     vulva  . Genital herpes simplex type 2   . Hyperkeratosis     RIGHT ANTERIOR VULVA  . Cluster headaches   . Frequency of urination   . Wears contact lenses   . Hiatal hernia 08/31/14    colonoscopy done 8/11 by Dr. Paulita Fujita    Past Surgical History  Procedure Laterality Date  . Colonoscopy  08/31/14    Dr. Paulita Fujita  . Metatarsal osteotomy with bunionectomy Bilateral left 05-16-2014//   right 12/ 2014  . Laser ablation condolamata N/A 09/28/2013    Procedure: CO2 LASER ABLATION CONDYLOMA;  Surgeon: Thurnell Lose, MD;  Location: [facility];  Service: Gynecology;  Laterality: N/A;  . Esophageal biopsy N/A 09/28/2013    Procedure: VULVAR BIOPSY;  Surgeon: Thurnell Lose, MD;  Location: [facility];  Service: Gynecology;  Laterality: N/A;  . Wisdom tooth extraction  1998  . Tonsillectomy  2000  . Negative sleep study  12-14-2010  . Vulvectomy N/A 08/14/2014    Procedure: WIDE LOCAL EXCISION VULVA;  Surgeon: Everitt Amber, MD;  Location: [facility];  Service: Gynecology;  Laterality: N/A;    There were no vitals filed for this  visit.  Visit Diagnosis:  Muscle tightness  Walking difficulty due to ankle and foot  Pain in joint, ankle and foot, left      Subjective Assessment - 12/26/14 1018    Subjective Lt foot (2/10) just under lateral 2 toes.  I got Rolfed this AM.                          [facility] Adult PT Treatment/Exercise - 12/26/14 1026    Knee/Hip Exercises: Standing   Forward Step Up Both;1 set;10 reps;Hand Hold: 2   Forward Step Up Limitations cues to maintain level pelvis    Other Standing Knee Exercises hip abd x 20 each leg, cues to keep feet flat    Iontophoresis   Type of Iontophoresis Dexamethasone   Location Met heads  and post calf L   Dose 4 mg/ml 1 cc.  x 2   Time 6 hours   Ankle Exercises: Seated   Towel Crunch 3 reps   Towel Inversion/Eversion 5 reps   BAPS Level 3   BAPS Limitations circles x 5 each direction    Ankle Exercises: Stretches   Soleus Stretch 2 reps;30 seconds   Gastroc Stretch 2 reps;30 seconds   Ankle Exercises: Standing   Rocker Board 3 minutes  forward balance and lateral , static    Heel Raises 20 reps                PT Education - 12/26/14 1036    Education provided Yes   Education Details foot intrinsic   Person(s) Educated Patient   Methods Explanation   Comprehension Verbalized understanding          PT Short Term Goals - 12/22/14 1206    PT SHORT TERM GOAL #1   Title Pt will be I with initial HEP    Time 4   Period Weeks   Status Achieved   PT SHORT TERM GOAL #2   Title Pt will report less pain/discomfort in foot at rest (<2/10)   Time 4   Period Weeks   Status On-going           PT Long Term Goals - 12/08/14 1128    PT LONG TERM GOAL #1   Title Pt will be a advanced HEP for LEs   Time 8   Period Weeks   Status New   PT LONG TERM GOAL #2   Title Pt will be able to stand/walk at work for up to 4 hours at a time with pain </=3/10 in LLE    Time 8   Period Weeks   Status New   PT LONG TERM GOAL #3    Title Pt will be able to balance in LLE for 15 sec at a time or more for dynamic balance activity.    Time 8   Period Weeks   Status New   PT LONG TERM GOAL #4   Title Pt will report understanding of posture and LE alignment and how it contributes to joint pain   Time 8   Period Weeks   Status New               Plan - 12/26/14 1101    Clinical Impression Statement Weakness noted in standing ex (glute, VMO).  Worked on foot instrinsics, fatigued.     PT Next Visit Plan cont with LE strengthening, alignment and foot ROM   PT Home Exercise Plan stand achilles, supine ITB and hamstring, step stretch to calf   Consulted and Agree with Plan of Care Patient     Check goals next visit   Problem List Patient Active Problem List   Diagnosis Date Noted  . 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 Hyperkeratosis of skin 07/31/2014    PAA,JENNIFER 12/26/2014, 11:02 AM  [facility] [address] [place], [place], [address] Phone: [phone]   Fax:  [phone]  Name: Holly Lynch MRN: [id number] Date of Birth: [birth year]   Raeford Razor, PT 12/26/2014 11:03 AM Phone: [phone] Fax: [phone]

## 2015-01-01 ENCOUNTER — Ambulatory Visit: Payer: 59 | Admitting: Physical Therapy

## 2015-01-01 DIAGNOSIS — G729 Myopathy, unspecified: Secondary | ICD-10-CM | POA: Diagnosis not present

## 2015-01-01 DIAGNOSIS — R262 Difficulty in walking, not elsewhere classified: Secondary | ICD-10-CM

## 2015-01-01 DIAGNOSIS — M6289 Other specified disorders of muscle: Secondary | ICD-10-CM

## 2015-01-01 DIAGNOSIS — M25572 Pain in left ankle and joints of left foot: Secondary | ICD-10-CM

## 2015-01-01 NOTE — Therapy (Signed)
[facility] [address] [place], [place], [address] Phone: [phone]   Fax:  [phone]  Physical Therapy Treatment  Patient Details  Name: Holly GainesFaith M Lynch MRN: [id number] Date of Birth: 1985/06/11 Referring Provider: Eilleen KempfParekh  Encounter Date: 01/01/2015      PT End of Session - 01/01/15 1543    Visit Number 6   Number of Visits 16   Date for PT Re-Evaluation 02/02/15   PT Start Time 1500   PT Stop Time 1553   PT Time Calculation (min) 53 min   Activity Tolerance Patient tolerated treatment well   Behavior During Therapy [facility] for tasks assessed/performed      Past Medical History  Diagnosis Date  . Insomnia   . Anxiety   . ADD (attention deficit disorder)   . History of esophagitis     grade b  . GERD (gastroesophageal reflux disease)   . Mass of labium   . History of condyloma acuminatum     vulva  . Genital herpes simplex type 2   . Hyperkeratosis     RIGHT ANTERIOR VULVA  . Cluster headaches   . Frequency of urination   . Wears contact lenses   . Hiatal hernia 08/31/14    colonoscopy done 8/11 by Dr. Dulce Sellarutlaw    Past Surgical History  Procedure Laterality Date  . Colonoscopy  08/31/14    Dr. Dulce Sellarutlaw  . Metatarsal osteotomy with bunionectomy Bilateral left 05-16-2014//   right 12/ 2014  . Laser ablation condolamata N/A 09/28/2013    Procedure: CO2 LASER ABLATION CONDYLOMA;  Surgeon: Geryl RankinsEvelyn Varnado, MD;  Location: [facility];  Service: Gynecology;  Laterality: N/A;  . Esophageal biopsy N/A 09/28/2013    Procedure: VULVAR BIOPSY;  Surgeon: Geryl RankinsEvelyn Varnado, MD;  Location: [facility];  Service: Gynecology;  Laterality: N/A;  . Wisdom tooth extraction  1998  . Tonsillectomy  2000  . Negative sleep study  12-14-2010  . Vulvectomy N/A 08/14/2014    Procedure: WIDE LOCAL EXCISION VULVA;  Surgeon: Adolphus BirchwoodEmma Rossi, MD;  [facility];  Service: Gynecology;  Laterality: N/A;    There were no vitals filed for this  visit.  Visit Diagnosis:  Muscle tightness  Walking difficulty due to ankle and foot  Pain in joint, ankle and foot, left      Subjective Assessment - 01/01/15 1455    Subjective Lt foot is really sore today.  i am probably not going to do too much.  5/10  It started hurting on Friday.  Not sure why,  Knotted.  Shoe hurts it is at the surgery site.   See's MD next week.   Currently in Pain? Yes   Pain Score 5    Pain Location Toe (Comment which one)  pinkie   Pain Orientation Left   Pain Descriptors / Indicators --  knot   Aggravating Factors  not sure   Pain Relieving Factors padding area                         [facility] Adult PT Treatment/Exercise - 01/01/15 1507    Knee/Hip Exercises: Aerobic   Stationary Bike 5 minutes 0 resistance.   Knee/Hip Exercises: Standing   Heel Raises 10 reps;2 sets  sitting   Cryotherapy   Number Minutes Cryotherapy 15 Minutes   Cryotherapy Location --  foot   Type of Cryotherapy --  vasopneumatic, cold only   Ankle Exercises: Aerobic   Stationary  Bike L05 minutes   Ankle Exercises: Seated   ABC's --  1 rep, Capitals, printing   [place] Pickup 18   Heel Raises --  10 X2 sets. cues for equal weight bearing, medial lateral.     Ankle Exercises: Supine   T-Band 10 X 2 , red band PF/DF                  PT Short Term Goals - 01/01/15 1547    PT SHORT TERM GOAL #1   Title Pt will be I with initial HEP    Time 4   Period Weeks   Status Achieved           PT Long Term Goals - 12/08/14 1128    PT LONG TERM GOAL #1   Title Pt will be a advanced HEP for LEs   Time 8   Period Weeks   Status New   PT LONG TERM GOAL #2   Title Pt will be able to stand/walk at work for up to 4 hours at a time with pain </=3/10 in LLE    Time 8   Period Weeks   Status New   PT LONG TERM GOAL #3   Title Pt will be able to balance in LLE for 15 sec at a time or more for dynamic balance activity.    Time 8   Period Weeks    Status New   PT LONG TERM GOAL #4   Title Pt will report understanding of posture and LE alignment and how it contributes to joint pain   Time 8   Period Weeks   Status New               Plan - 01/01/15 1544    Clinical Impression Statement Sitting exercises due to pain increased with standing.  Bump lateral little toe new.  PT Jenn PAA inspected.  It is moveable but not a blister,  RED.  May need to get orthotics/shoes changed.   PT Next Visit Plan exercise as tolerated.     PT Home Exercise Plan avoid painful exercises   Consulted and Agree with Plan of Care Patient        Problem List Patient Active Problem List   Diagnosis Date Noted  . 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 Hyperkeratosis of skin 07/31/2014    Holly Lynch 01/01/2015, 6:06 PM  [facility] [address] [place], [place], [address] Phone: [phone]   Fax:  [phone]  Name: Holly Lynch MRN: [id number] Date of Birth: [birth year]    Liz Beach, PTA 01/01/2015 6:06 PM Phone: [phone] Fax: [phone]

## 2015-01-05 ENCOUNTER — Ambulatory Visit: Payer: 59 | Admitting: Physical Therapy

## 2015-01-05 DIAGNOSIS — R262 Difficulty in walking, not elsewhere classified: Secondary | ICD-10-CM

## 2015-01-05 DIAGNOSIS — M25572 Pain in left ankle and joints of left foot: Secondary | ICD-10-CM

## 2015-01-05 DIAGNOSIS — G729 Myopathy, unspecified: Secondary | ICD-10-CM | POA: Diagnosis not present

## 2015-01-05 DIAGNOSIS — M6289 Other specified disorders of muscle: Secondary | ICD-10-CM

## 2015-01-05 NOTE — Therapy (Signed)
[place], [place], [address] Phone: [phone]   Fax:  [phone]  Physical Therapy Treatment  Patient Details  Name: Holly Lynch MRN: [id number] Date of Birth: 1986-01-02 Referring Provider: Donnie Mesa  Encounter Date: 01/05/2015      PT End of Session - 01/05/15 1100    Visit Number 7   Number of Visits 16   Date for PT Re-Evaluation 02/02/15   PT Start Time 1020   PT Stop Time 1108   PT Time Calculation (min) 48 min   Activity Tolerance Patient tolerated treatment well   Behavior During Therapy [facility] for tasks assessed/performed      Past Medical History  Diagnosis Date  . Insomnia   . Anxiety   . ADD (attention deficit disorder)   . History of esophagitis     grade b  . GERD (gastroesophageal reflux disease)   . Mass of labium   . History of condyloma acuminatum     vulva  . Genital herpes simplex type 2   . Hyperkeratosis     RIGHT ANTERIOR VULVA  . Cluster headaches   . Frequency of urination   . Wears contact lenses   . Hiatal hernia 08/31/14    colonoscopy done 8/11 by Dr. Paulita Fujita    Past Surgical History  Procedure Laterality Date  . Colonoscopy  08/31/14    Dr. Paulita Fujita  . Metatarsal osteotomy with bunionectomy Bilateral left 05-16-2014//   right 12/ 2014  . Laser ablation condolamata N/A 09/28/2013    Procedure: CO2 LASER ABLATION CONDYLOMA;  Surgeon: Thurnell Lose, MD;  Location: [facility];  Service: Gynecology;  Laterality: N/A;  . Esophageal biopsy N/A 09/28/2013    Procedure: VULVAR BIOPSY;  Surgeon: Thurnell Lose, MD;  Location: [facility];  Service: Gynecology;  Laterality: N/A;  . Wisdom tooth extraction  1998  . Tonsillectomy  2000  . Negative sleep study  12-14-2010  . Vulvectomy N/A 08/14/2014    Procedure: WIDE LOCAL EXCISION VULVA;  Surgeon: Everitt Amber, MD;  Location: [facility];  Service: Gynecology;  Laterality: N/A;    There were no vitals filed for this  visit.  Visit Diagnosis:  Muscle tightness  Walking difficulty due to ankle and foot  Pain in joint, ankle and foot, left      Subjective Assessment - 01/05/15 1023    Subjective 3/10 L foot, I started limping yesterday.  Donnald Garre been working past 3 days.  Switched shoes and that helped that red bump.    Currently in Pain? Yes   Pain Score 3    Pain Location Foot   Pain Orientation Left   Pain Type Chronic pain   Pain Onset More than a month ago   Pain Frequency Intermittent                         [facility] Adult PT Treatment/Exercise - 01/05/15 1028    Knee/Hip Exercises: Stretches   Active Hamstring Stretch Both;1 rep;60 seconds   Knee/Hip Exercises: Standing   Heel Raises Both;3 sets;10 reps;20 reps   Heel Raises Limitations on floor    Forward Step Up Both;1 set;10 reps;Hand Hold: 1   Step Down Both;1 set;10 reps;Hand Hold: 1   SLS static, then added UE exercises with green band : row, ext and diagonal pull    Knee/Hip Exercises: Supine   Straight Leg Raise with External Rotation Strengthening;Both;2 sets;10 reps   Straight Leg  Raise with External Rotation Limitations diff maintaining full knee ext on Rt.    Cryotherapy   Number Minutes Cryotherapy 8 Minutes   Cryotherapy Location --  L foot   Type of Cryotherapy Ice pack   Ankle Exercises: Supine   T-Band Green, 10 x 2 with PT holding band, also another set with patient looping around opp foot. gave to HEP                PT Education - 01/05/15 1059    Education provided Yes   Education Details single leg balance HEP with therabnd   Person(s) Educated Patient   Methods Explanation   Comprehension Verbalized understanding;Returned demonstration          PT Short Term Goals - 01/01/15 1547    PT SHORT TERM GOAL #1   Title Pt will be I with initial HEP    Time 4   Period Weeks   Status Achieved           PT Long Term Goals - 01/05/15 1101    PT LONG TERM GOAL #1   Title Pt will  be a advanced HEP for LEs   Status On-going   PT LONG TERM GOAL #2   Title Pt will be able to stand/walk at work for up to 4 hours at a time with pain </=3/10 in LLE    Status On-going   PT LONG TERM GOAL #3   Title Pt will be able to balance in LLE for 15 sec at a time or more for dynamic balance activity.    Status On-going   PT LONG TERM GOAL #4   Title Pt will report understanding of posture and LE alignment and how it contributes to joint pain   Status On-going               Plan - 01/05/15 1104    Clinical Impression Statement Tolerated 20+ min of standing ex, including step downs, without increase in pain.  No new goals met.    PT Next Visit Plan consider taping? ask Santiago Glad , cont to strengthen and work on propriocption, intrinsics, SLS   PT Home Exercise Plan added T band today for INV and EV, HS stretch, SLR with ER., stretching   Consulted and Agree with Plan of Care Patient      See what MD said  Problem List Patient Active Problem List   Diagnosis Date Noted  . 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 Hyperkeratosis of skin 07/31/2014    Cire Clute 01/05/2015, 11:10 AM  [facility] [address] [place], [place], [address] Phone: [phone]   Fax:  [phone]  Name: Holly Lynch MRN: [id number] Date of Birth: [birth year]  Raeford Razor, PT 01/05/2015 11:10 AM Phone: [phone] Fax: [phone]

## 2015-01-05 NOTE — Patient Instructions (Signed)
Inversion: Resisted   Cross legs with right leg underneath, foot in tubing loop. Hold tubing around other foot to resist and turn foot in. Repeat __10-20__ times per set. Do __1-2__ sets per session. Do __1__ sessions per day.  http://orth.exer.us/12   Copyright  VHI. All rights reserved.  Eversion: Resisted   With right foot in tubing loop, hold tubing around other foot to resist and turn foot out. Repeat __10-20__ times per set. Do __1-2__ sets per session. Do _1__ sessions per day.  http://orth.exer.us/14

## 2015-01-08 ENCOUNTER — Ambulatory Visit: Payer: 59 | Admitting: Physical Therapy

## 2015-01-08 DIAGNOSIS — R262 Difficulty in walking, not elsewhere classified: Secondary | ICD-10-CM

## 2015-01-08 DIAGNOSIS — M6289 Other specified disorders of muscle: Secondary | ICD-10-CM

## 2015-01-08 DIAGNOSIS — M25572 Pain in left ankle and joints of left foot: Secondary | ICD-10-CM

## 2015-01-08 DIAGNOSIS — G729 Myopathy, unspecified: Secondary | ICD-10-CM | POA: Diagnosis not present

## 2015-01-08 NOTE — Therapy (Addendum)
[place], [place], [address] Phone: [phone]   Fax:  [phone]  Physical Therapy Treatment  Patient Details  Name: LANI HAVLIK MRN: [id number] Date of Birth: 06/21/1985 Referring Provider: Donnie Mesa  Encounter Date: 01/08/2015      PT End of Session - 01/08/15 1411    Visit Number 8   Number of Visits 16   Date for PT Re-Evaluation 02/02/15   PT Start Time 4656   PT Stop Time 1410   PT Time Calculation (min) 32 min   Activity Tolerance Patient tolerated treatment well;No increased pain   Behavior During Therapy [facility] for tasks assessed/performed      Past Medical History  Diagnosis Date  . Insomnia   . Anxiety   . ADD (attention deficit disorder)   . History of esophagitis     grade b  . GERD (gastroesophageal reflux disease)   . Mass of labium   . History of condyloma acuminatum     vulva  . Genital herpes simplex type 2   . Hyperkeratosis     RIGHT ANTERIOR VULVA  . Cluster headaches   . Frequency of urination   . Wears contact lenses   . Hiatal hernia 08/31/14    colonoscopy done 8/11 by Dr. Paulita Fujita    Past Surgical History  Procedure Laterality Date  . Colonoscopy  08/31/14    Dr. Paulita Fujita  . Metatarsal osteotomy with bunionectomy Bilateral left 05-16-2014//   right 12/ 2014  . Laser ablation condolamata N/A 09/28/2013    Procedure: CO2 LASER ABLATION CONDYLOMA;  Surgeon: Thurnell Lose, MD;  Location: [facility];  Service: Gynecology;  Laterality: N/A;  . Esophageal biopsy N/A 09/28/2013    Procedure: VULVAR BIOPSY;  Surgeon: Thurnell Lose, MD;  Location: [facility];  Service: Gynecology;  Laterality: N/A;  . Wisdom tooth extraction  1998  . Tonsillectomy  2000  . Negative sleep study  12-14-2010  . Vulvectomy N/A 08/14/2014    Procedure: WIDE LOCAL EXCISION VULVA;  Surgeon: Everitt Amber, MD;  Location: [facility];  Service: Gynecology;  Laterality: N/A;    There were no  vitals filed for this visit.  Visit Diagnosis:  Muscle tightness  Walking difficulty due to ankle and foot  Pain in joint, ankle and foot, left      Subjective Assessment - 01/08/15 1340    Subjective 2/10.Clarnce Flock MD  No need for PT.     Pain Score 2    Pain Location Foot   Pain Orientation Left   Pain Descriptors / Indicators Aching   Pain Frequency Intermittent   Aggravating Factors  shoes too tight                         [facility] Adult PT Treatment/Exercise - 01/08/15 1406    Knee/Hip Exercises: Standing   Heel Raises 20 reps  LT   Ankle Exercises: Standing   SLS 30 + seconds   Toe Raise --  both 10 X, small motions, added to home exercise   Other Standing Ankle Exercises 6 inch step up, forward/ lateral step ups   Ankle Exercises: Seated   Ankle Circles/Pumps --  verbal review   Towel Inversion/Eversion --  verbal review   Marble Pickup --  review bands   Other Seated Ankle Exercises bands green 10 X IV/EV    Ankle Exercises: Stretches   Gastroc Stretch --  review   Other  Stretch MMT knee 5/5,  DF4+/5,  PF 5/5, EV/IV 4+/5  LT                PT Education - 01/08/15 1410    Education provided Yes   Education Details home ex.  Foe painful and good days.   From ex drawer. ankle stand, towel, AROM, bands   Person(s) Educated Patient   Methods Explanation;Demonstration;Tactile cues;Verbal cues;Handout   Comprehension Verbalized understanding;Returned demonstration          PT Short Term Goals - 01/08/15 1414    PT SHORT TERM GOAL #1   Title Pt will be I with initial HEP    Period Weeks   Status Achieved   PT SHORT TERM GOAL #2   Title Pt will report less pain/discomfort in foot at rest (<2/10)   Baseline 2/10 on non work days,  higher with work days.   Time 4   Period Weeks   Status Not Met           PT Long Term Goals - 01/08/15 1415    PT LONG TERM GOAL #1   Title Pt will be a advanced HEP for LEs   Time 8   Period Weeks    Status Achieved   PT LONG TERM GOAL #2   Title Pt will be able to stand/walk at work for up to 4 hours at a time with pain </=3/10 in LLE    Time 8   Period Weeks   Status Partially Met   PT LONG TERM GOAL #3   Baseline 30+   Time 8   Period Weeks   Status Achieved   PT LONG TERM GOAL #4   Title Pt will report understanding of posture and LE alignment and how it contributes to joint pain   Time 8   Period Weeks   Status Achieved               Plan - 01/08/15 1413    Clinical Impression Statement MD instructed patient to d/C PT.  Able to finalize home program.     PT Next Visit Plan D/C   Consulted and Agree with Plan of Care Patient        Problem List Patient Active Problem List   Diagnosis Date Noted  . 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 Hyperkeratosis of skin 07/31/2014    HARRIS,KAREN 01/08/2015, 2:16 PM  [facility] [address] [place], [place], [address] Phone: [phone]   Fax:  [phone]  Name: KATELYNNE REVAK MRN: [id number] Date of Birth: Jun 03, 1985    Melvenia Needles, PTA 01/08/2015 2:16 PM Phone: [phone] Fax: [phone]   PHYSICAL THERAPY DISCHARGE SUMMARY  Visits from Start of Care: 8  Current functional level related to goals / functional outcomes: See above for goals met.   Remaining deficits: Stability, balance and ROM in ankle/toes   Education / Equipment: HEP, orthotics Plan: Patient agrees to discharge.  Patient goals were partially met. Patient is being discharged due to the physician's request.  ?????   Pt pleased with functional level, pt stated MD said she didn't need PT anymore.   Raeford Razor, PT 01/10/2015 1:24 PM Phone: [phone] Fax: [phone]

## 2015-01-11 ENCOUNTER — Encounter: Payer: 59 | Admitting: Physical Therapy

## 2015-01-12 ENCOUNTER — Encounter: Payer: 59 | Admitting: Physical Therapy

## 2015-01-26 ENCOUNTER — Encounter: Payer: 59 | Admitting: Physical Therapy

## 2015-01-29 MED FILL — NORTREL 1-35 TABLET: 1-35 | 84 days supply | Qty: 63 | Fill #0

## 2015-01-31 MED FILL — PANTOPRAZOLE SOD DR 40 MG T: 40 | 30 days supply | Qty: 30 | Fill #1

## 2015-01-31 MED FILL — CITALOPRAM HBR 40 MG TABLET: 40 | 90 days supply | Qty: 90 | Fill #0

## 2015-02-02 ENCOUNTER — Encounter: Payer: 59 | Admitting: Physical Therapy

## 2015-02-05 ENCOUNTER — Other Ambulatory Visit: Payer: Self-pay | Admitting: Obstetrics and Gynecology

## 2015-02-05 DIAGNOSIS — F4310 Post-traumatic stress disorder, unspecified: Secondary | ICD-10-CM | POA: Diagnosis not present

## 2015-02-05 DIAGNOSIS — N761 Subacute and chronic vaginitis: Secondary | ICD-10-CM | POA: Diagnosis not present

## 2015-02-05 DIAGNOSIS — Z113 Encounter for screening for infections with a predominantly sexual mode of transmission: Secondary | ICD-10-CM | POA: Diagnosis not present

## 2015-02-05 DIAGNOSIS — N9089 Other specified noninflammatory disorders of vulva and perineum: Secondary | ICD-10-CM | POA: Diagnosis not present

## 2015-02-12 MED FILL — TERCONAZOLE 0.4% VAG CREAM: 0.4 | 7 days supply | Qty: 45 | Fill #0

## 2015-02-16 DIAGNOSIS — T85848A Pain due to other internal prosthetic devices, implants and grafts, initial encounter: Secondary | ICD-10-CM | POA: Diagnosis not present

## 2015-02-16 DIAGNOSIS — M7742 Metatarsalgia, left foot: Secondary | ICD-10-CM | POA: Diagnosis not present

## 2015-02-20 DIAGNOSIS — M7742 Metatarsalgia, left foot: Secondary | ICD-10-CM | POA: Diagnosis not present

## 2015-02-20 DIAGNOSIS — T85848A Pain due to other internal prosthetic devices, implants and grafts, initial encounter: Secondary | ICD-10-CM | POA: Diagnosis not present

## 2015-02-27 MED FILL — PANTOPRAZOLE SOD DR 40 MG T: 40 | 90 days supply | Qty: 90 | Fill #0

## 2015-02-28 DIAGNOSIS — H16202 Unspecified keratoconjunctivitis, left eye: Secondary | ICD-10-CM | POA: Diagnosis not present

## 2015-02-28 MED FILL — TOBRAMYCIN-DEXAMETH OPTH SU: 0.3-0.1 | 7 days supply | Qty: 5 | Fill #0

## 2015-03-05 DIAGNOSIS — M7742 Metatarsalgia, left foot: Secondary | ICD-10-CM | POA: Diagnosis not present

## 2015-03-05 DIAGNOSIS — T85848D Pain due to other internal prosthetic devices, implants and grafts, subsequent encounter: Secondary | ICD-10-CM | POA: Diagnosis not present

## 2015-03-14 MED FILL — VYVANSE 70 MG CAPSULE: 70 | 30 days supply | Qty: 30 | Fill #0

## 2015-03-22 DIAGNOSIS — F4310 Post-traumatic stress disorder, unspecified: Secondary | ICD-10-CM | POA: Diagnosis not present

## 2015-03-23 MED FILL — MELOXICAM 15 MG TABLET: 15 | 30 days supply | Qty: 30 | Fill #2

## 2015-03-26 MED FILL — raNITIdine HCL 300 MG TABS: 300 | 90 days supply | Qty: 90 | Fill #1

## 2015-03-28 DIAGNOSIS — N9089 Other specified noninflammatory disorders of vulva and perineum: Secondary | ICD-10-CM | POA: Diagnosis not present

## 2015-03-28 DIAGNOSIS — G43829 Menstrual migraine, not intractable, without status migrainosus: Secondary | ICD-10-CM | POA: Diagnosis not present

## 2015-03-28 MED FILL — NORTREL 1-35 TABLET: 1-35 | 70 days supply | Qty: 63 | Fill #0

## 2015-04-10 MED FILL — traZODone HCL 100 MG TABS: 100 | 90 days supply | Qty: 90 | Fill #0

## 2015-04-11 MED FILL — VYVANSE 70 MG CAPSULE: 70 | 30 days supply | Qty: 30 | Fill #0

## 2015-04-20 DIAGNOSIS — R5383 Other fatigue: Secondary | ICD-10-CM | POA: Diagnosis not present

## 2015-04-20 DIAGNOSIS — Z79899 Other long term (current) drug therapy: Secondary | ICD-10-CM | POA: Diagnosis not present

## 2015-04-27 DIAGNOSIS — F4310 Post-traumatic stress disorder, unspecified: Secondary | ICD-10-CM | POA: Diagnosis not present

## 2015-05-07 MED FILL — CITALOPRAM HBR 40 MG TABLET: 40 | 90 days supply | Qty: 90 | Fill #1

## 2015-05-08 MED FILL — MELOXICAM 15 MG TABLET: 15 | 30 days supply | Qty: 30 | Fill #3

## 2015-05-08 MED FILL — DICYCLOMINE 10 MG CAPSULE: 10 | 30 days supply | Qty: 120 | Fill #1

## 2015-05-09 MED FILL — VYVANSE 70 MG CAPSULE: 70 | 30 days supply | Qty: 30 | Fill #0

## 2015-05-25 DIAGNOSIS — H16202 Unspecified keratoconjunctivitis, left eye: Secondary | ICD-10-CM | POA: Diagnosis not present

## 2015-05-25 DIAGNOSIS — F4310 Post-traumatic stress disorder, unspecified: Secondary | ICD-10-CM | POA: Diagnosis not present

## 2015-05-25 MED FILL — FLUOROMETHOLONE 0.1% DROPS: 0.1 | 7 days supply | Qty: 5 | Fill #0

## 2015-06-12 ENCOUNTER — Other Ambulatory Visit: Payer: Self-pay | Admitting: Ophthalmology

## 2015-06-12 DIAGNOSIS — H5310 Unspecified subjective visual disturbances: Secondary | ICD-10-CM | POA: Diagnosis not present

## 2015-06-12 DIAGNOSIS — G453 Amaurosis fugax: Secondary | ICD-10-CM

## 2015-06-18 MED FILL — NORTREL 1-35 TABLET: 1-35 | 70 days supply | Qty: 63 | Fill #1

## 2015-06-18 MED FILL — raNITIdine HCL 300 MG TABS: 300 | 90 days supply | Qty: 90 | Fill #2

## 2015-06-20 ENCOUNTER — Ambulatory Visit
Admission: RE | Admit: 2015-06-20 | Discharge: 2015-06-20 | Disposition: A | Discharge status: Home / Self Care | Payer: 59 | Source: Ambulatory Visit | Attending: Ophthalmology | Admitting: Ophthalmology

## 2015-06-20 DIAGNOSIS — R21 Rash and other nonspecific skin eruption: Secondary | ICD-10-CM | POA: Diagnosis not present

## 2015-06-20 DIAGNOSIS — G453 Amaurosis fugax: Secondary | ICD-10-CM

## 2015-06-20 MED ORDER — GADOBENATE DIMEGLUMINE 529 MG/ML IV SOLN: 18.0000 mL | Freq: Once | INTRAVENOUS | Status: DC | PRN

## 2015-06-20 MED FILL — VYVANSE 70 MG CAPSULE: 70 | 30 days supply | Qty: 30 | Fill #0

## 2015-06-25 ENCOUNTER — Encounter: Payer: Self-pay | Admitting: Neurology

## 2015-06-25 ENCOUNTER — Ambulatory Visit (INDEPENDENT_AMBULATORY_CARE_PROVIDER_SITE_OTHER): Payer: 59 | Admitting: Neurology

## 2015-06-25 VITALS — BP 110/69 | HR 94 | Ht 67.0 in | Wt 197.6 lb

## 2015-06-25 DIAGNOSIS — H539 Unspecified visual disturbance: Secondary | ICD-10-CM | POA: Diagnosis not present

## 2015-06-25 DIAGNOSIS — G4489 Other headache syndrome: Secondary | ICD-10-CM

## 2015-06-25 DIAGNOSIS — H53133 Sudden visual loss, bilateral: Secondary | ICD-10-CM | POA: Diagnosis not present

## 2015-06-25 DIAGNOSIS — G450 Vertebro-basilar artery syndrome: Secondary | ICD-10-CM | POA: Diagnosis not present

## 2015-06-25 NOTE — Progress Notes (Signed)
[facility]    Provider:  Dr Lucia Gaskins Referring Provider: Elias Else, MD Primary Care Physician:  Lolita Patella, MD  CC:  Vision changes possible migraines  HPI:  Marney Holly Lynch is a 30 y.o. female here as a referral from Dr. Nicholos Johns for vision changes possible migraines. Past medical history of the attention deficit disorder, insomnia, IBS, GERD, migraine, depression, anxiety, cluster headaches, dysphasia.Symptoms are positional. When she is laying on her side looking at the phone, she gets up and her right eye feels funny, right eye 10 shades darker. It would be so dark she couldn't even see a finger. She doesn't know how long the symptoms last. Never resolves. Vision loss is happening every week and is avoidable by not laying on her side. First time it happened was last year before October. Happened in both eyes. No pain.  No diplopia. Just everything is darker. Sometimes not as bad. When the darker is bad she can't even see her finger. Happened once in the daylight last week. At least a few times a week. And it feels weird. Not lightheaded, no weakness, no pain, no diplopia, definitely monocular, no headaches. She has a history of migraines at the age of 69, episodes of getting sweaty and dizzy, pain behind the left eye, stabbing, starts in the afternoon, worse with hormones, she gets a droopy eye on the left, no injection or rhinorrhea or lacrimation, she will take a vicadin and go to sleep and it gets better, no nausea, no aura, mother with migraines. She gets headaches at least once a month. Excedrin migraine doesn't affect it. Imitrex works if she gets it soon, but at work.   Reviewed notes, labs and imaging from outside physicians, which showed: CBC with differential was normal in March 2017, B12 467 March 2017, TSH 1.29 March 2015, vitamin D 57.5, HCV antibody negative, RPR reactive, HIV nonreactive, BMP normal with creatinine 0.74 August 2060, CRP mildly elevated at  12, hemoglobin A1c 5.23 November 2013, HIV nonreactive, RPR nonreactive.   Personally reviewed images MRI of the brain and agree with the following: MRI HEAD FINDINGS  No acute infarct, hemorrhage, or mass lesion is present. The ventricles are of normal size. No significant extraaxial fluid collection is present.  No significant white matter disease is present.  The internal auditory canals are within normal limits bilaterally. The brainstem and cerebellum are unremarkable.  Flow is present in the major intracranial arteries. The globes and orbits are intact. The paranasal sinuses and the mastoid air cells are clear.  The postcontrast images demonstrate no pathologic enhancement. The skullbase is within normal limits. Midline sagittal images are unremarkable.  MRI ORBITS FINDINGS  The globes are intact bilaterally. The optic nerve is within normal limits. There is no abnormal signal or enhancement. The intraorbital muscles are normal bilaterally. The lacrimal glands are unremarkable.  The optic chiasm and tracts are normal bilaterally.  The visualized cranial nerves are within normal limits. The paranasal sinuses and the mastoid air cells are clear.  No pathologic enhancement is present.  IMPRESSION: 1. Normal MRI the brain without and with contrast. 2. Normal MRI of the orbits without and with contrast.  Review of Systems: Patient complains of symptoms per HPI as well as the following symptoms: Weight gain, fatigue, loss of vision, easy bruising, palpitations, ringing in the ears, diarrhea chronic, cramps, aching muscles, rash, itching, skin sensitivity, headache, insomnia versus, anxiety, depression. Pertinent negatives per HPI. All others negative.   Social History  Social History  . Marital Status: Single    Spouse Name: N/A  . Number of Children: 0  . Years of Education: 20   Occupational History  . [employer]- nurse    Social History Main Topics  .  Smoking status: Never Smoker   . Smokeless tobacco: Never Used  . Alcohol Use: 1.0 oz/week    2 Standard drinks or equivalent per week     Comment: social   . Drug Use: No  . Sexual Activity: Not Currently   Other Topics Concern  . Not on file   Social History Narrative   Lives with sister   Caffeine use: soda/excedrin ocassional     Family History  Problem Relation Age of Onset  . Diabetes Paternal Aunt   . Charcot-Marie-Tooth disease Maternal Grandmother   . Heart disease Maternal Grandfather   . AAA (abdominal aortic aneurysm) Maternal Grandfather   . Glaucoma Maternal Grandfather   . Diabetes Paternal Grandmother   . Prostate cancer Paternal Grandfather   . Diabetes Paternal Grandfather   . Charcot-Marie-Tooth disease Mother   . Hypothyroidism Sister   . Bipolar disorder Sister   . Fibromyalgia Sister   . Narcolepsy Brother     Past Medical History  Diagnosis Date  . Insomnia   . Anxiety   . ADD (attention deficit disorder)   . History of esophagitis     grade b  . GERD (gastroesophageal reflux disease)   . Mass of labium   . History of condyloma acuminatum     vulva  . Genital herpes simplex type 2   . Hyperkeratosis     RIGHT ANTERIOR VULVA  . Cluster headaches   . Frequency of urination   . Wears contact lenses   . Hiatal hernia 08/31/14    colonoscopy done 8/11 by Dr. Dulce Sellar    Past Surgical History  Procedure Laterality Date  . Colonoscopy  08/31/14    Dr. Dulce Sellar  . Metatarsal osteotomy with bunionectomy Bilateral left 05-16-2014//   right 12/ 2014  . Laser ablation condolamata N/A 09/28/2013    Procedure: CO2 LASER ABLATION CONDYLOMA;  Surgeon: Geryl Rankins, MD;  Location: [facility];  Service: Gynecology;  Laterality: N/A;  . Biopsy N/A 09/28/2013    Procedure: VULVAR BIOPSY;  Surgeon: Geryl Rankins, MD;  Location: [facility];  Service: Gynecology;  Laterality: N/A;  . Wisdom tooth extraction  1998  . Tonsillectomy  2000  . Negative sleep study   12-14-2010  . Vulvectomy N/A 08/14/2014    Procedure: WIDE LOCAL EXCISION VULVA;  Surgeon: Adolphus Birchwood, MD;  Location: [facility];  Service: Gynecology;  Laterality: N/A;    Current Outpatient Prescriptions  Medication Sig Dispense Refill  . citalopram (CELEXA) 40 MG tablet Take 40 mg by mouth every morning.     . conjugated estrogens (PREMARIN) vaginal cream Place 1 Applicatorful vaginally daily. Use externally as directed. (Patient taking differently: Place 1 Applicatorful vaginally daily as needed. Use externally as directed.) 42.5 g 0  . dicyclomine (BENTYL) 10 MG capsule Take 10 mg by mouth 2 (two) times daily as needed.   2  . HYDROcodone-acetaminophen (NORCO/VICODIN) 5-325 MG per tablet Take 1 tablet by mouth every 6 (six) hours as needed for moderate pain (for cluster headaches).    Marland Kitchen ibuprofen (ADVIL,MOTRIN) 800 MG tablet Take 1 tablet (800 mg total) by mouth every 8 (eight) hours as needed. 30 tablet 0  . lisdexamfetamine (VYVANSE) 70 MG capsule Take 70 mg by  mouth every morning.    . Multiple Vitamins-Minerals (HM MULTIVITAMIN ADULT GUMMY) CHEW Chew 1 tablet by mouth daily.    . norethindrone-ethinyl estradiol 1/35 (ORTHO-NOVUM, NORTREL,CYCLAFEM) tablet Take 1 tablet by mouth every morning.     . pantoprazole (PROTONIX) 40 MG tablet Take 40 mg by mouth every morning.     . ranitidine (ZANTAC) 300 MG tablet Take 300 mg by mouth at bedtime.    . SUMAtriptan (IMITREX) 25 MG tablet Take 25 mg by mouth once as needed for migraine. May repeat in 2 hours if headache persists or recurs.    . traZODone (DESYREL) 50 MG tablet Take 100 mg by mouth at bedtime.     . valACYclovir (VALTREX) 500 MG tablet Take 500 mg by mouth as needed.      No current facility-administered medications for this visit.    Allergies as of 06/25/2015  . (No Known Allergies)    Vitals: BP 110/69 mmHg  Pulse 94  Ht 5\' 7"  (1.702 m)  Wt 197 lb 9.6 oz (89.631 kg)  BMI 30.94 kg/m2 Last Weight:  Wt  Readings from Last 1 Encounters:  06/25/15 197 lb 9.6 oz (89.631 kg)   Last Height:   Ht Readings from Last 1 Encounters:  06/25/15 5\' 7"  (1.702 m)    Physical exam: Exam: Gen: NAD, conversant, well nourised, obese, well groomed                     CV: RRR, no MRG. No Carotid Bruits. No peripheral edema, warm, nontender Eyes: Conjunctivae clear without exudates or hemorrhage  Neuro: Detailed Neurologic Exam  Speech:    Speech is normal; fluent and spontaneous with normal comprehension.  Cognition:    The patient is oriented to person, place, and time;     recent and remote memory intact;     language fluent;     normal attention, concentration,     fund of knowledge Cranial Nerves:    The pupils are equal, round, and reactive to light. The fundi are normal and spontaneous venous pulsations are present. Visual fields are full to finger confrontation. Extraocular movements are intact. Trigeminal sensation is intact and the muscles of mastication are normal. The face is symmetric. The palate elevates in the midline. Hearing intact. Voice is normal. Shoulder shrug is normal. The tongue has normal motion without fasciculations.   Coordination:    Normal finger to nose and heel to shin. Normal rapid alternating movements.   Gait:    Heel-toe and tandem gait are normal.   Motor Observation:    No asymmetry, no atrophy, and no involuntary movements noted. Tone:    Normal muscle tone.    Posture:    Posture is normal. normal erect    Strength:    Strength is V/V in the upper and lower limbs.      Sensation: intact to LT     Reflex Exam:  DTR's:    Deep tendon reflexes in the upper and lower extremities are normal bilaterally.   Toes:    The toes are downgoing bilaterally.   Clonus:    Clonus is absent.      Assessment/Plan:  30 y.o. female here as a referral from Dr. Nicholos Johns for vision changes possible migraines. Past medical history of the attention deficit disorder,  insomnia, IBS, GERD, migraine, depression, anxiety, cluster headaches, dysphasia. Patient has decreased visual acuity which is positional and after she is laying down and stands but usually only  in the evenings if she's been laying on her side extensive workup to date has been excellent including lab work and imaging and ophthalmology evaluation. She does have a history of migraines however it is unusual that the symptoms only happen in certain very specific situations be elicited. I asked her to keep a diary, how long this happens for, check her blood pressure possibly (could this be a hypoperfusion issue for example), also asked her that when the symptoms occur to be looking for modifiable factors such to the symptoms get better if she lays back down and how long episode lasts etc.  Will check  CTA for vertebrovasilar hypoperfusion Keep a diary of precipitating and modifying factors Blood pressure measurement during events Watch how much fluid intake you had that day, is it related? Very unclear etiology of her symptoms    Naomie DeanAntonia Ahern, MD  [facility] [address] [place], [place] [id number]  Phone [phone] Fax [phone]

## 2015-06-25 NOTE — Patient Instructions (Addendum)
Remember to drink plenty of fluid, eat healthy meals and do not skip any meals. Try to eat protein with a every meal and eat a healthy snack such as fruit or nuts in between meals. Try to keep a regular sleep-wake schedule and try to exercise daily, particularly in the form of walking, 20-30 minutes a day, if you can.   As far as your medications are concerned, I would like to suggest: Zomig at onset of migraine. If this works, call for prescription  As far as diagnostic testing: CT Angio of the head Keep a diary of precipitating and modifying factors Blood pressure measurement Watch how much fluid intake you had that day, is it related?  Our phone number is [phone]. We also have an after hours call service for urgent matters and there is a physician on-call for urgent questions. For any emergencies you know to call 911 or go to the nearest emergency room

## 2015-06-28 ENCOUNTER — Telehealth: Payer: Self-pay | Admitting: Neurology

## 2015-06-28 DIAGNOSIS — F4310 Post-traumatic stress disorder, unspecified: Secondary | ICD-10-CM | POA: Diagnosis not present

## 2015-06-28 NOTE — Telephone Encounter (Signed)
Sent notes to Dr. Misty StanleyBowens ofc/bws

## 2015-07-02 ENCOUNTER — Telehealth: Payer: Self-pay | Admitting: *Deleted

## 2015-07-02 ENCOUNTER — Ambulatory Visit
Admission: RE | Admit: 2015-07-02 | Discharge: 2015-07-02 | Disposition: A | Discharge status: Home / Self Care | Payer: 59 | Source: Ambulatory Visit | Attending: Neurology | Admitting: Neurology

## 2015-07-02 DIAGNOSIS — G4489 Other headache syndrome: Secondary | ICD-10-CM

## 2015-07-02 DIAGNOSIS — H539 Unspecified visual disturbance: Secondary | ICD-10-CM

## 2015-07-02 DIAGNOSIS — H53133 Sudden visual loss, bilateral: Secondary | ICD-10-CM

## 2015-07-02 DIAGNOSIS — R51 Headache: Secondary | ICD-10-CM | POA: Diagnosis not present

## 2015-07-02 IMAGING — CT CT ANGIO HEAD
1 of 10 series · 12 of 47 positions shown · IV contrast (APPLIED)
Comparison: MRI [DATE]

CLINICAL DATA: Sudden bilateral vision loss. Monocular visual
disturbance. Headache syndrome.

EXAM:
CT ANGIOGRAPHY HEAD
TECHNIQUE: Multidetector CT imaging of the head was performed using the
standard protocol during bolus administration of intravenous
contrast. Multiplanar CT image reconstructions and MIPs were
obtained to evaluate the vascular anatomy.
CONTRAST:  80 mL Isovue 370 IV

[Series 8: thins · axial · 0.41mm/px · z∈[-92,+25]mm · 12 of 231 slices shown]
[im 18/231  brain]
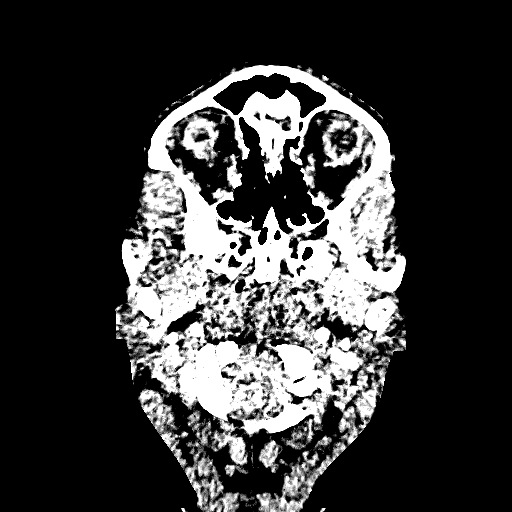
[im 36/231  bone]
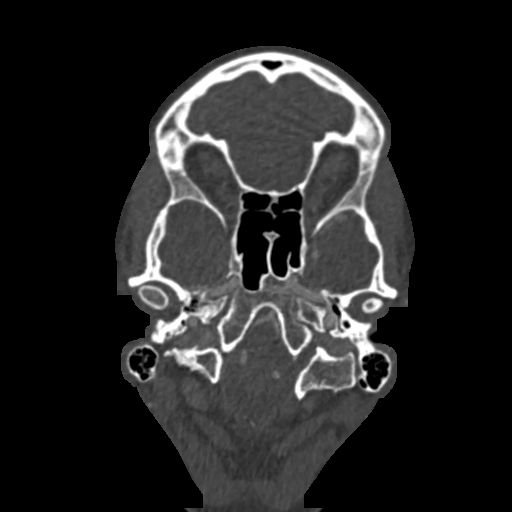
[im 54/231  brain]
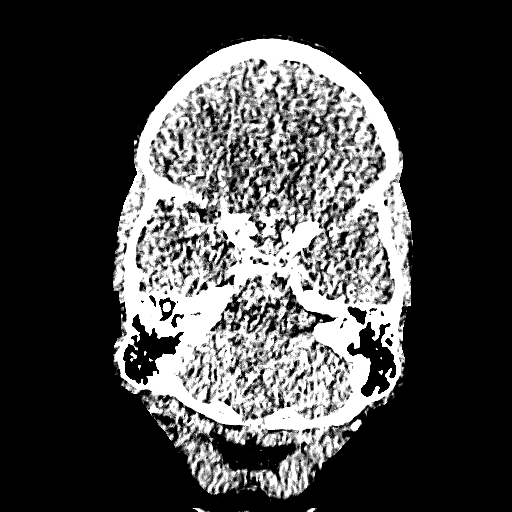
[im 71/231  bone]
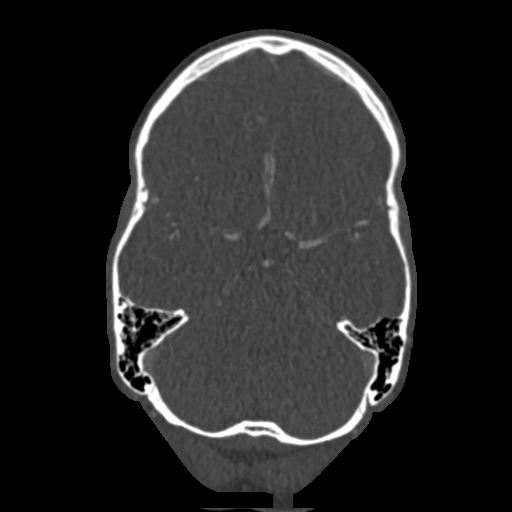
[im 89/231  brain]
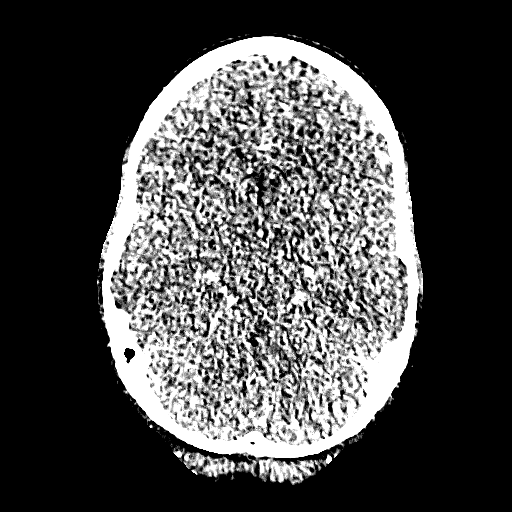
[im 107/231  bone]
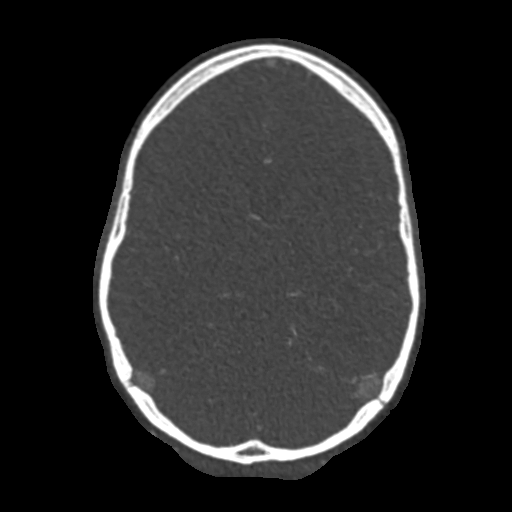
[im 124/231  brain]
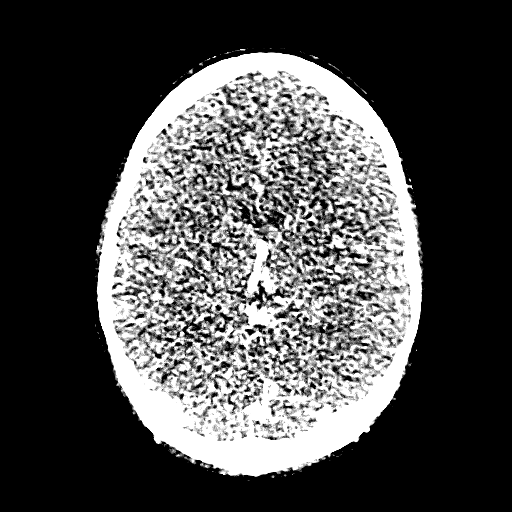
[im 142/231  bone]
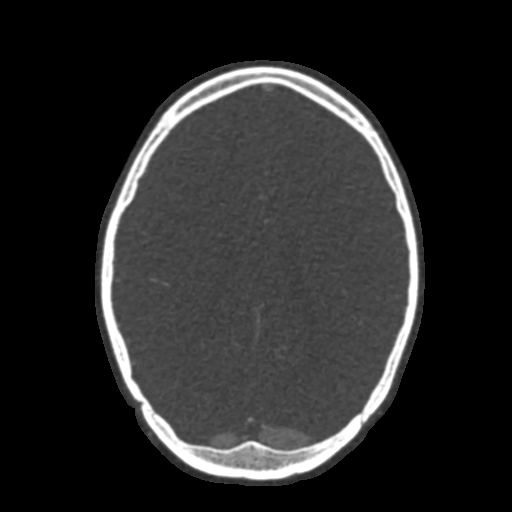
[im 160/231  brain]
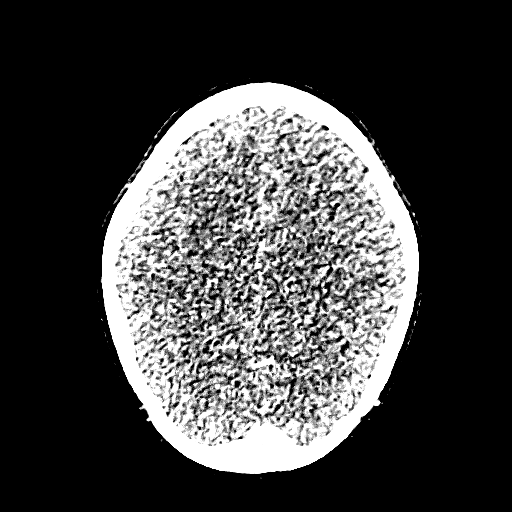
[im 177/231  bone]
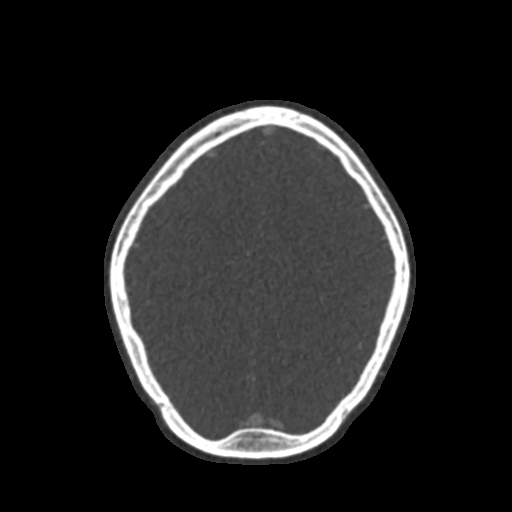
[im 195/231  brain]
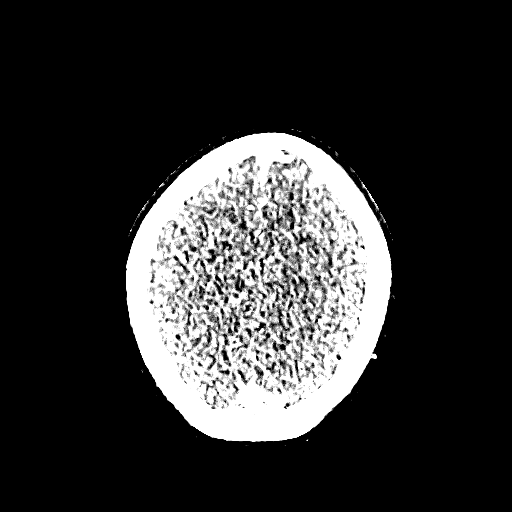
[im 213/231  bone]
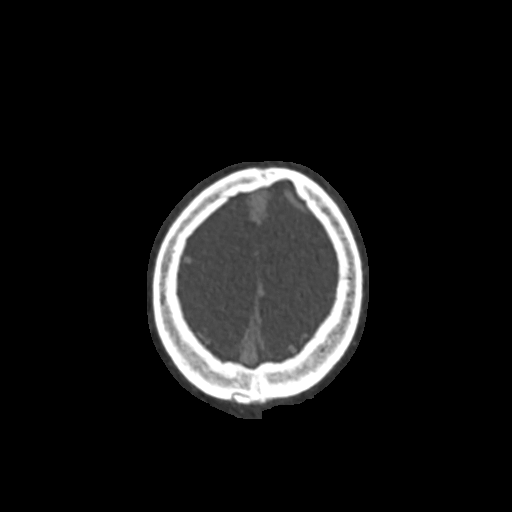

[12 of 47 positions shown; findings below may reference images not displayed]

FINDINGS: CT HEAD

Brain: Ventricle size is normal. Cerebral volume is normal. Negative
for acute or chronic infarction. Negative for hemorrhage or mass
lesion.

Calvarium and skull base: Negative

Paranasal sinuses: Negative

Orbits: Negative

CTA HEAD

Anterior circulation: Cavernous carotid widely patent bilaterally
without stenosis or aneurysm. Anterior and middle cerebral arteries
normal. No stenosis or aneurysm.

Posterior circulation: Both vertebral arteries patent to the
basilar. PICA patent bilaterally. Basilar widely patent. Superior
cerebellar and posterior cerebral arteries patent bilaterally. No
posterior fossa cerebral aneurysm.

Venous sinuses: Patent

Anatomic variants: None

Delayed phase:Normal enhancement on delayed imaging. No enhancing
mass lesion.
IMPRESSION: Normal CTA head

## 2015-07-02 MED ORDER — IOPAMIDOL (ISOVUE-370) INJECTION 76%
80.0000 mL | Freq: Once | INTRAVENOUS | Status: AC | PRN
  Administered 2015-07-02: 80 mL via INTRAVENOUS

## 2015-07-02 NOTE — Telephone Encounter (Signed)
-----   Message from Anson FretAntonia B Ahern, MD sent at 07/02/2015  2:57 PM EDT ----- CTAngio of the head was normal thanks

## 2015-07-02 NOTE — Telephone Encounter (Signed)
I have spoken with Holly Lynch this afternoon and per AA, advised that CT angio of the head was normal.  She verbalized understanding of same/fim

## 2015-07-04 MED FILL — PANTOPRAZOLE SOD DR 40 MG T: 40 | 90 days supply | Qty: 90 | Fill #0

## 2015-07-05 MED FILL — traZODone HCL 100 MG TABS: 100 | 90 days supply | Qty: 90 | Fill #0

## 2015-07-11 DIAGNOSIS — F4310 Post-traumatic stress disorder, unspecified: Secondary | ICD-10-CM | POA: Diagnosis not present

## 2015-07-20 DIAGNOSIS — L308 Other specified dermatitis: Secondary | ICD-10-CM | POA: Diagnosis not present

## 2015-07-23 DIAGNOSIS — R21 Rash and other nonspecific skin eruption: Secondary | ICD-10-CM | POA: Diagnosis not present

## 2015-07-23 DIAGNOSIS — R768 Other specified abnormal immunological findings in serum: Secondary | ICD-10-CM | POA: Diagnosis not present

## 2015-07-26 DIAGNOSIS — F4310 Post-traumatic stress disorder, unspecified: Secondary | ICD-10-CM | POA: Diagnosis not present

## 2015-07-26 DIAGNOSIS — Z113 Encounter for screening for infections with a predominantly sexual mode of transmission: Secondary | ICD-10-CM | POA: Diagnosis not present

## 2015-08-07 MED FILL — CITALOPRAM HBR 40 MG TABLET: 40 | 90 days supply | Qty: 90 | Fill #0

## 2015-08-11 ENCOUNTER — Other Ambulatory Visit: Payer: Self-pay | Admitting: Neurology

## 2015-08-11 MED ORDER — SUMATRIPTAN 20 MG/ACT NA SOLN: 20.0000 mg | Freq: Once | NASAL | Status: AC

## 2015-08-19 MED FILL — NORTREL 1-35 TABLET: 1-35 | 70 days supply | Qty: 63 | Fill #2

## 2015-08-28 DIAGNOSIS — Z3202 Encounter for pregnancy test, result negative: Secondary | ICD-10-CM | POA: Diagnosis not present

## 2015-08-28 DIAGNOSIS — Z30430 Encounter for insertion of intrauterine contraceptive device: Secondary | ICD-10-CM | POA: Diagnosis not present

## 2015-09-07 DIAGNOSIS — G453 Amaurosis fugax: Secondary | ICD-10-CM | POA: Diagnosis not present

## 2015-09-10 MED FILL — raNITIdine HCL 300 MG TABS: 300 | 90 days supply | Qty: 90 | Fill #3

## 2015-09-14 MED FILL — VYVANSE 70 MG CAPSULE: 70 | 30 days supply | Qty: 30 | Fill #0

## 2015-09-25 ENCOUNTER — Encounter: Payer: Self-pay | Admitting: Neurology

## 2015-09-25 ENCOUNTER — Ambulatory Visit (INDEPENDENT_AMBULATORY_CARE_PROVIDER_SITE_OTHER): Payer: 59 | Admitting: Neurology

## 2015-09-25 VITALS — BP 127/77 | HR 94 | Ht 67.0 in | Wt 204.8 lb

## 2015-09-25 DIAGNOSIS — G5603 Carpal tunnel syndrome, bilateral upper limbs: Secondary | ICD-10-CM

## 2015-09-25 DIAGNOSIS — H53133 Sudden visual loss, bilateral: Secondary | ICD-10-CM | POA: Diagnosis not present

## 2015-09-25 DIAGNOSIS — G43909 Migraine, unspecified, not intractable, without status migrainosus: Secondary | ICD-10-CM | POA: Insufficient documentation

## 2015-09-25 DIAGNOSIS — G43009 Migraine without aura, not intractable, without status migrainosus: Secondary | ICD-10-CM | POA: Diagnosis not present

## 2015-09-25 DIAGNOSIS — R202 Paresthesia of skin: Secondary | ICD-10-CM

## 2015-09-25 DIAGNOSIS — G600 Hereditary motor and sensory neuropathy: Secondary | ICD-10-CM | POA: Diagnosis not present

## 2015-09-25 DIAGNOSIS — Z820 Family history of epilepsy and other diseases of the nervous system: Secondary | ICD-10-CM | POA: Diagnosis not present

## 2015-09-25 NOTE — Progress Notes (Signed)
Faxed completed form for zembrace/sumatriptan to [facility]. Fax: [phone]. Received confirmation.

## 2015-09-25 NOTE — Patient Instructions (Addendum)
Remember to drink plenty of fluid, eat healthy meals and do not skip any meals. Try to eat protein with a every meal and eat a healthy snack such as fruit or nuts in between meals. Try to keep a regular sleep-wake schedule and try to exercise daily, particularly in the form of walking, 20-30 minutes a day, if you can.   As far as your medications are concerned, I would like to suggest: Onzetra at onset of headache may repeat in 2 hours  As far as diagnostic testing: emg/ncs  I would like to see you back for emg/ncs, sooner if we need to. Please call us with any interim questions, concerns, problems, updates or refill requests.   Our phone number is [phone]. We also have an after hours call service for urgent matters and there is a physician on-call for urgent questions. For any emergencies you know to call 911 or go to the nearest emergency room Sumatriptan injection What is this medicine? SUMATRIPTAN (soo ma TRIP tan) is used to treat migraines with or without aura. An aura is a strange feeling or visual disturbance that warns you of an attack. It is not used to prevent migraines. This medicine may be used for other purposes; ask your health care provider or pharmacist if you have questions. What should I tell my health care provider before I take this medicine? They need to know if you have any of these conditions: -circulation problems in fingers and toes -diabetes -heart disease -high blood pressure -high cholesterol -history of irregular heartbeat -history of stroke -kidney disease -liver disease -postmenopausal or surgical removal of uterus and ovaries -seizures -smoke tobacco -stomach or intestine problems -an unusual or allergic reaction to sumatriptan, other medicines, foods, dyes, or preservatives -pregnant or trying to get pregnant -breast-feeding How should I use this medicine? This medicine is for injection under the skin. Follow the directions on the prescription  label. Only use this medicine at the first symptoms of a migraine. It is not for everyday use. If you are using an autoinjector, read the instruction leaflet carefully. A single injection is given just under the skin. Before you make an injection, clean and examine your skin. Do not inject at a place where the skin is damaged or infected. If your symptoms return you can use a second injection. If there is no improvement at all in your symptoms after the first injection, call your doctor or health care professional. Wait at least 1 hour between doses and do not use more than 6 mg as a single dose. Do not use more than 12 mg total in any 24 hour period. Do not use your medicine more often than directed. Talk to your pediatrician regarding the use of this medicine in children. Special care may be needed. Overdosage: If you think you have taken too much of this medicine contact a poison control center or emergency room at once. NOTE: This medicine is only for you. Do not share this medicine with others. What if I miss a dose? This does not apply; this medicine is not for regular use. What may interact with this medicine? Do not take this medicine with any of the following medicines: -cocaine -ergot alkaloids like dihydroergotamine, ergonovine, ergotamine, methylergonovine -feverfew -MAOIs like Carbex, Eldepryl, Marplan, Nardil, and Parnate -other medicines for migraine headache like almotriptan, eletriptan, frovatriptan, naratriptan, rizatriptan, zolmitriptan -tryptophan This medicine may also interact with the following medications: -certain medicines for depression, anxiety, or psychotic disturbances This list may not describe  all possible interactions. Give your health care provider a list of all the medicines, herbs, non-prescription drugs, or dietary supplements you use. Also tell them if you smoke, drink alcohol, or use illegal drugs. Some items may interact with your medicine. What should I watch  for while using this medicine? Only take this medicine for a migraine headache. Take it if you get warning symptoms or at the start of a migraine attack. It is not for regular use to prevent migraine attacks. You may get drowsy or dizzy. Do not drive, use machinery, or do anything that needs mental alertness until you know how this medicine affects you. Do not stand or sit up quickly, especially if you are an older patient. This reduces the risk of dizzy or fainting spells. Alcohol may interfere with the effect of this medicine. Avoid alcoholic drinks. Smoking cigarettes may increase the risk of heart-related side effects from using this medicine. If you take migraine medicines for 10 or more days a month, your migraines may get worse. Keep a diary of headache days and medicine use. Contact your healthcare professional if your migraine attacks occur more frequently. What side effects may I notice from receiving this medicine? Side effects that you should report to your doctor or health care professional as soon as possible: -allergic reactions like skin rash, itching or hives, swelling of the face, lips, or tongue -bloody or watery diarrhea -hallucination, loss of contact with reality -pain, tingling, numbness in the face, hands, or feet -seizures -signs and symptoms of a blood clot such as breathing problems; changes in vision; chest pain; severe, sudden headache; pain, swelling, warmth in the leg; trouble speaking; sudden numbness or weakness of the face, arm, or leg -signs and symptoms of a dangerous change in heartbeat or heart rhythm like chest pain; dizziness; fast or irregular heartbeat; palpitations, feeling faint or lightheaded; falls; breathing problems -signs and symptoms of a stroke like changes in vision; confusion; trouble speaking or understanding; severe headaches; sudden numbness or weakness of the face, arm, or leg; trouble walking; dizziness; loss of balance or coordination -stomach  pain Side effects that usually do not require medical attention (report these to your doctor or health care professional if they continue or are bothersome): -changes in taste -facial flushing -headache -muscle cramps -muscle pain -nausea, vomiting -pain, redness, or irritation at site where injected -weak or tired This list may not describe all possible side effects. Call your doctor for medical advice about side effects. You may report side effects to FDA at 1-800-FDA-1088. Where should I keep my medicine? Keep out of the reach of children. Store at room temperature between 2 and 30 degrees C (36 and 86 degrees F). Protect from light. Throw away any unused medicine after the expiration date. Make sure you receive a puncture-resistant container to dispose of the needles and syringes once you have finished with them. Do not reuse these items. Return the container to your health care professional for proper disposal. Keep the autoinjector device. NOTE: This sheet is a summary. It may not cover all possible information. If you have questions about this medicine, talk to your doctor, pharmacist, or health care provider.    2016, [employer]. (2014-04-03 13:35:11)  Sumatriptan nasal powder What is this medicine? SUMATRIPTAN (soo ma TRIP tan) is used to treat migraines with or without aura. An aura is a strange feeling or visual disturbance that warns you of an attack. It is not used to prevent migraines. This medicine may be  used for other purposes; ask your health care provider or pharmacist if you have questions. What should I tell my health care provider before I take this medicine? They need to know if you have any of these conditions: -circulation problems in fingers and toes -diabetes -heart disease -high blood pressure -high cholesterol -history of irregular heartbeat -history of stroke -kidney disease -liver disease -postmenopausal or surgical removal of uterus and  ovaries -seizures -smoke tobacco -stomach or intestine problems -an unusual or allergic reaction to sumatriptan, other medicines, foods, dyes, or preservatives -pregnant or trying to get pregnant -breast-feeding How should I use this medicine? This medicine is for use in the nose. Follow the directions on the prescription label. This medicine is taken at the first symptoms of a migraine. It is not for everyday use. Do not take your medicine more often than directed. Talk to your pediatrician regarding the use of this medicine in children. Special care may be needed. Overdosage: If you think you have taken too much of this medicine contact a poison control center or emergency room at once. NOTE: This medicine is only for you. Do not share this medicine with others. What if I miss a dose? This does not apply; this medicine is not for regular use. What may interact with this medicine? Do not take this medicine with any of the following medicines: -cocaine -ergot alkaloids like dihydroergotamine, ergonovine, ergotamine, methylergonovine -feverfew -MAOIs like Carbex, Eldepryl, Marplan, Nardil, and Parnate -other medicines for migraine headache like almotriptan, eletriptan, frovatriptan, naratriptan, rizatriptan, zolmitriptan -tryptophan This medicine may also interact with the following medications: -certain medicines for depression, anxiety, or psychotic disturbances This list may not describe all possible interactions. Give your health care provider a list of all the medicines, herbs, non-prescription drugs, or dietary supplements you use. Also tell them if you smoke, drink alcohol, or use illegal drugs. Some items may interact with your medicine. What should I watch for while using this medicine? Only take this medicine for a migraine headache. Take it if you get warning symptoms or at the start of a migraine attack. It is not for regular use to prevent migraine attacks. You may get drowsy  or dizzy. Do not drive, use machinery, or do anything that needs mental alertness until you know how this medicine affects you. Do not stand or sit up quickly, especially if you are an older patient. This reduces the risk of dizzy or fainting spells. Alcohol may interfere with the effect of this medicine. Avoid alcoholic drinks. Smoking cigarettes may increase the risk of heart-related side effects from using this medicine. If you take migraine medicines for 10 or more days a month, your migraines may get worse. Keep a diary of headache days and medicine use. Contact your healthcare professional if your migraine attacks occur more frequently. What side effects may I notice from receiving this medicine? Side effects that you should report to your doctor or health care professional as soon as possible: -allergic reactions like skin rash, itching or hives, swelling of the face, lips, or tongue -bloody or watery diarrhea -hallucination, loss of contact with reality -pain, tingling, numbness in the face, hands, or feet -seizures -signs and symptoms of a blood clot such as breathing problems; changes in vision; chest pain; severe, sudden headache; pain, swelling, warmth in the leg; trouble speaking; sudden numbness or weakness of the face, arm, or leg -signs and symptoms of a dangerous change in heartbeat or heart rhythm like chest pain; dizziness; fast or irregular  heartbeat; palpitations, feeling faint or lightheaded; falls; breathing problems -signs and symptoms of a stroke like changes in vision; confusion; trouble speaking or understanding; severe headaches; sudden numbness or weakness of the face, arm, or leg; trouble walking; dizziness; loss of balance or coordination -stomach pain Side effects that usually do not require medical attention (report these to your doctor or health care professional if they continue or are bothersome): -changes in taste -facial flushing -headache -muscle  cramps -muscle pain -nausea, vomiting -weak or tired This list may not describe all possible side effects. Call your doctor for medical advice about side effects. You may report side effects to FDA at 1-800-FDA-1088. Where should I keep my medicine? Keep out of the reach of children. Store at room temperature between 15 and 30 degrees C (59 and 86 degrees F). Throw away any unused medicine after the expiration date. NOTE: This sheet is a summary. It may not cover all possible information. If you have questions about this medicine, talk to your doctor, pharmacist, or health care provider.    2016, [employer]. (2014-09-05 16:02:30)

## 2015-09-25 NOTE — Progress Notes (Signed)
[facility]    Provider:  Dr Lucia GaskinsAhern Referring Provider: Elias Elseeade, Robert, MD Primary Care Physician:  Lolita PatellaEADE,ROBERT ALEXANDER, MD   CC:  Vision changes possible migraines  Interval history 09/25/2015:  CTA of the head was normal. MRi of the brain and orbits were normal. She was evaluated by ophthalmology. Neurologic exam normal.  If she just lays down it is fine no symptoms. Only when looking at something bright in a dark room. It happens mostly in the right eye but also in the left eye, it switches back and forth always in one or the other. Offered to send her to [facility] but she declines. Her migraines are 1-2 x a month improved with mirena. Better. Imitrex helps just with bad side effects, spray didn't help as much as the pills, with the pills she gets squeezing. She get numbness in the hands more positional, could be CTS, she has to wait and shake them out, mostly at night. She ahs a hx of CMT in mother. She has some shooting pain in the legs numbness and tingling into the toes.   HPI:  Holly Lynch is a 30 y.o. female here as a referral from Dr. Nicholos Johnseade for vision changes possible migraines. Past medical history of the attention deficit disorder, insomnia, IBS, GERD, migraine, depression, anxiety, cluster headaches, dysphasia.Symptoms are positional. When she is laying on her side looking at the phone, she gets up and her right eye feels funny, right eye 10 shades darker. It would be so dark she couldn't even see a finger. She doesn't know how long the symptoms last. Never resolves. Vision loss is happening every week and is avoidable by not laying on her side. First time it happened was last year before October. Happened in both eyes. No pain.  No diplopia. Just everything is darker. Sometimes not as bad. When the darker is bad she can't even see her finger. Happened once in the daylight last week. At least a few times a week. And it feels weird.  Not lightheaded, no weakness, no pain, no diplopia, definitely monocular, no headaches. She has a history of migraines at the age of 30, episodes of getting sweaty and dizzy, pain behind the left eye, stabbing, starts in the afternoon, worse with hormones, she gets a droopy eye on the left, no injection or rhinorrhea or lacrimation, she will take a vicadin and go to sleep and it gets better, no nausea, no aura, mother with migraines. She gets headaches at least once a month. Excedrin migraine doesn't affect it. Imitrex works if she gets it soon, but at work.   Reviewed notes, labs and imaging from outside physicians, which showed: CBC with differential was normal in March 2017, B12 467 March 2017, TSH 1.29 March 2015, vitamin D 57.5, HCV antibody negative, RPR reactive, HIV nonreactive, BMP normal with creatinine 0.74 August 2060, CRP mildly elevated at 12, hemoglobin A1c 5.23 November 2013, HIV nonreactive, RPR nonreactive.   Personally reviewed images MRI of the brain and agree with the following: MRI HEAD FINDINGS  No acute infarct, hemorrhage, or mass lesion is present. The ventricles are of normal size. No significant extraaxial fluid collection is present.  No significant white matter disease is present.  The internal auditory canals are within normal limits bilaterally. The brainstem and cerebellum are unremarkable.  Flow is present in the major intracranial arteries. The globes and orbits are intact. The paranasal sinuses and the mastoid air cells are clear.  The postcontrast images demonstrate no pathologic enhancement. The skullbase is within normal limits. Midline sagittal images are unremarkable.  MRI ORBITS FINDINGS  The globes are intact bilaterally. The optic nerve is within normal limits. There is no abnormal signal or enhancement. The intraorbital muscles are normal bilaterally. The lacrimal glands are unremarkable.  The optic chiasm and tracts are normal  bilaterally.  The visualized cranial nerves are within normal limits. The paranasal sinuses and the mastoid air cells are clear.  No pathologic enhancement is present.  IMPRESSION: 1. Normal MRI the brain without and with contrast. 2. Normal MRI of the orbits without and with contrast.  Review of Systems: Patient complains of symptoms per HPI as well as the following symptoms: Weight gain, fatigue, loss of vision, easy bruising, palpitations, ringing in the ears, diarrhea chronic, cramps, aching muscles, rash, itching, skin sensitivity, headache, insomnia versus, anxiety, depression. Pertinent negatives per HPI. All others negative.    Social History   Social History  . Marital status: Single    Spouse name: N/A  . Number of children: 0  . 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 Smoking status: Never Smoker  . Smokeless tobacco: Never Used  . Alcohol use 1.0 oz/week    2 Standard drinks or equivalent per week     Comment: social   . Drug use: No  . Sexual activity: Not Currently   Other Topics Concern  . Not on file   Social History Narrative   Lives with sister   Caffeine use: soda/excedrin ocassional     Family History  Problem Relation Age of Onset  . Charcot-Marie-Tooth disease Mother   . Diabetes Paternal Aunt   . Charcot-Marie-Tooth disease Maternal Grandmother   . Heart disease Maternal Grandfather   . AAA (abdominal aortic aneurysm) Maternal Grandfather   . Glaucoma Maternal Grandfather   . Diabetes Paternal Grandmother   . Prostate cancer Paternal Grandfather   . Diabetes Paternal Grandfather   . Hypothyroidism Sister   . Bipolar disorder Sister   . Fibromyalgia Sister   . Narcolepsy Brother     Past Medical History:  Diagnosis Date  . ADD (attention deficit disorder)   . Anxiety   . Cluster headaches   . Frequency of urination   . Genital herpes simplex type 2   . GERD  (gastroesophageal reflux disease)   . Hiatal hernia 08/31/14   colonoscopy done 8/11 by Dr. Dulce Sellar  . History of condyloma acuminatum    vulva  . History of esophagitis    grade b  . Hyperkeratosis    RIGHT ANTERIOR VULVA  . Insomnia   . Mass of labium   . Wears contact lenses     Past Surgical History:  Procedure Laterality Date  . BIOPSY N/A 09/28/2013   Procedure: VULVAR BIOPSY;  Surgeon: Geryl Rankins, MD;  Location: [facility];  Service: Gynecology;  Laterality: N/A;  . COLONOSCOPY  08/31/14   Dr. Dulce Sellar  . LASER ABLATION CONDOLAMATA N/A 09/28/2013   Procedure: CO2 LASER ABLATION CONDYLOMA;  Surgeon: Geryl Rankins, MD;  Location: [facility];  Service: Gynecology;  Laterality: N/A;  . METATARSAL OSTEOTOMY WITH BUNIONECTOMY Bilateral left 05-16-2014//   right 12/ 2014  . NEGATIVE SLEEP STUDY  12-14-2010  . TONSILLECTOMY  2000  . VULVECTOMY N/A 08/14/2014   Procedure: WIDE LOCAL EXCISION VULVA;  Surgeon: Adolphus Birchwood, MD;  Location: [facility];  Service: Gynecology;  Laterality: N/A;  . WISDOM TOOTH EXTRACTION  1998    Current Outpatient Prescriptions  Medication Sig Dispense Refill  . citalopram (CELEXA) 40 MG tablet Take 40 mg by mouth every morning.     . conjugated estrogens (PREMARIN) vaginal cream Place 1 Applicatorful vaginally daily. Use externally as directed. (Patient taking differently: Place 1 Applicatorful vaginally daily as needed. Use externally as directed.) 42.5 g 0  . dicyclomine (BENTYL) 10 MG capsule Take 10 mg by mouth 2 (two) times daily as needed.   2  . HYDROcodone-acetaminophen (NORCO/VICODIN) 5-325 MG per tablet Take 1 tablet by mouth every 6 (six) hours as needed for moderate pain (for cluster headaches).    Marland Kitchen ibuprofen (ADVIL,MOTRIN) 800 MG tablet Take 1 tablet (800 mg total) by mouth every 8 (eight) hours as needed. 30 tablet 0  . lisdexamfetamine (VYVANSE) 70 MG capsule Take 70 mg by mouth every morning.    . Multiple Vitamins-Minerals (HM  MULTIVITAMIN ADULT GUMMY) CHEW Chew 1 tablet by mouth daily.    . pantoprazole (PROTONIX) 40 MG tablet Take 40 mg by mouth every morning.     . ranitidine (ZANTAC) 300 MG tablet Take 300 mg by mouth at bedtime.    . SUMAtriptan (IMITREX) 20 MG/ACT nasal spray Place 1 spray (20 mg total) into the nose once. May repeat in 2 hours once if headache persists or recurs. 12 Inhaler 12  . SUMAtriptan (IMITREX) 25 MG tablet Take 25 mg by mouth once as needed for migraine. May repeat in 2 hours if headache persists or recurs.    . traZODone (DESYREL) 50 MG tablet Take 100 mg by mouth at bedtime.     . valACYclovir (VALTREX) 500 MG tablet Take 500 mg by mouth as needed.      No current facility-administered medications for this visit.     Allergies as of 09/25/2015  . (No Known Allergies)    Vitals: BP 127/77 (BP Location: Left Arm, Patient Position: Sitting, Cuff Size: Large)   Pulse 94   Ht 5\' 7"  (1.702 m)   Wt 204 lb 12.8 oz (92.9 kg)   BMI 32.08 kg/m  Last Weight:  Wt Readings from Last 1 Encounters:  09/25/15 204 lb 12.8 oz (92.9 kg)   Last Height:   Ht Readings from Last 1 Encounters:  09/25/15 5\' 7"  (1.702 m)     Physical exam: Exam: Gen: NAD, conversant, well nourised, obese, well groomed                     CV: RRR, no MRG. No Carotid Bruits. No peripheral edema, warm, nontender Eyes: Conjunctivae clear without exudates or hemorrhage  Neuro: Detailed Neurologic Exam  Speech:    Speech is normal; fluent and spontaneous with normal comprehension.  Cognition:    The patient is oriented to person, place, and time;     recent and remote memory intact;     language fluent;     normal attention, concentration,     fund of knowledge Cranial Nerves:    The pupils are equal, round, and reactive to light. The fundi are normal and spontaneous venous pulsations are present. Visual fields are full to finger confrontation. Extraocular movements are intact. Trigeminal sensation is  intact and the muscles of mastication are normal. The face is symmetric. The palate elevates in the midline. Hearing intact. Voice is normal. Shoulder shrug is normal. The tongue has normal motion without fasciculations.   Coordination:  Normal finger to nose and heel to shin. Normal rapid alternating movements.   Gait:    Heel-toe and tandem gait are normal.   Motor Observation:    No asymmetry, no atrophy, and no involuntary movements noted. Tone:    Normal muscle tone.    Posture:    Posture is normal. normal erect    Strength:    Strength is V/V in the upper and lower limbs.      Sensation: intact to LT     Reflex Exam:  DTR's:    Deep tendon reflexes in the upper and lower extremities are normal bilaterally.   Toes:    The toes are downgoing bilaterally.   Clonus:    Clonus is absent.      Assessment/Plan:  30 y.o. female here as a referral from Dr. Nicholos Johns for vision changes possible migraines. Past medical history of the attention deficit disorder, insomnia, IBS, GERD, migraine, depression, anxiety, cluster headaches, dysphasia. Patient has decreased visual acuity which is positional and after she is laying down and looking at something bright in a dark room,  workup to date has been excellent including lab work and imaging and ophthalmology evaluation, MRI brain and orbits and CTA head. She does have a history of migraines however it is unusual that the symptoms only happen in certain very specific situations be elicited. I asked her to keep a diary, how long this happens for, check her blood pressure possibly (could this be a hypoperfusion issue for example), also asked her that when the symptoms occur to be looking for modifiable factors such to the symptoms get better if she lays back down and how long episode lasts etc.   CTA for vertebrovasilar hypoperfusion was normal. Normal MRI brain and orbits.  Keep a diary of precipitating and modifying factors Blood  pressure measurement during events Watch how much fluid intake you had that day, is it related? Very unclear etiology of her symptoms, likely benign as it only happens in the dark if she is looking at something bright. 4-limb emg of arms and legs to eval for CMT and bilateral CTS. Sister, mother and grandmother with CMT.  Migraine; try Onzetra and/or zembrace at onset. Tried imitrex pill with side effects, tried Ibuprofen and OTC meds, tried zomig and imitrex nasal spray. Discussed side effects as per AVS  Naomie Dean, MD  [facility] [address] [place], [place] [address]  Phone [phone] Fax [phone]  A total of 30 minutes was spent face-to-face with this patient. Over half this time was spent on counseling patient on the migraine, vision loss, paresthesias/CTS diagnosis and different diagnostic and therapeutic options available.

## 2015-10-08 MED FILL — PANTOPRAZOLE SOD DR 40 MG T: 40 | 90 days supply | Qty: 90 | Fill #0

## 2015-10-08 MED FILL — traZODone HCL 100 MG TABS: 100 | 90 days supply | Qty: 90 | Fill #0

## 2015-10-10 ENCOUNTER — Ambulatory Visit (INDEPENDENT_AMBULATORY_CARE_PROVIDER_SITE_OTHER): Payer: 59 | Admitting: Neurology

## 2015-10-10 ENCOUNTER — Ambulatory Visit (INDEPENDENT_AMBULATORY_CARE_PROVIDER_SITE_OTHER): Payer: Self-pay | Admitting: Neurology

## 2015-10-10 DIAGNOSIS — G8929 Other chronic pain: Secondary | ICD-10-CM

## 2015-10-10 DIAGNOSIS — G600 Hereditary motor and sensory neuropathy: Secondary | ICD-10-CM

## 2015-10-10 DIAGNOSIS — G5603 Carpal tunnel syndrome, bilateral upper limbs: Secondary | ICD-10-CM

## 2015-10-10 DIAGNOSIS — M545 Low back pain: Secondary | ICD-10-CM

## 2015-10-10 DIAGNOSIS — Z0289 Encounter for other administrative examinations: Secondary | ICD-10-CM

## 2015-10-10 MED ORDER — CYCLOBENZAPRINE HCL 10 MG PO TABS: 10.0000 mg | ORAL_TABLET | Freq: Three times a day (TID) | ORAL | 3 refills | Status: AC | PRN

## 2015-10-10 NOTE — Progress Notes (Signed)
  [facility]    Provider:  Dr Lucia GaskinsAhern Referring Provider: Elias Elseeade, Robert, MD Primary Care Physician:  Lolita PatellaEADE,ROBERT ALEXANDER, MD  HPI:  Holly Lynch is a 30 y.o. 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 Patient has a significant family history of Charcot-Marie-Tooth in her MGM, mother and several sisters. Patient experiences neuropathic pain in the feet.  Summary  Nerve conduction studies were performed on the bilateral upper and lower extremities:  The right Median motor nerve showed prolonged onset latency(9.647ms, N<4.0) and prolonged F Wave latency(50.444ms, N<34). The left Median motor nerve showed prolonged distal onset latency(10.331ms, N<4.0) and delayed F Wave latency(49.66ms, N<34). The right Ulnar motor nerve showed showed prolonged distal onset latency(7.319ms, N<4.2), reduced amplitude(3.599mV, N>5), decreased conduction velocity (Elbow-Wrist, 33 m/s, N>47),  decreased conduction velocity (above elbow-below elbow, 37 m/s, N>45)  and delayed F Wave latency(54.466ms, N<34). The left Ulnar motor nerve showed showed prolonged distal onset latency(9.592ms, N<4.2), reduced amplitude(1.700mV, N>5)  and delayed F Wave latency(51.554ms, N<34).  The right Median sensory nerve showed prolonged distal peak latency(9.583ms, N<3.9) and reduced amplitude(5vV,N>10) The left Median sensory nerve showed prolonged distal peak latency(7.439ms, N<3.9) and reduced amplitude(5vV,N>10)   The right Ulnar sensory nerve showed prolonged distal peak latency(5.48ms, N<3.5) and reduced amplitude(5vV,N>10) The left Ulnar sensory nerve showed prolonged distal peak latency(5.349ms, N<3.5) and reduced amplitude(5vV,N>10)  The right Peroneal motor nerve showed prolonged distal onset latency(9.33ms, N<7.3), reduced amplitude(441mV, N>2), decreased conduction velocity (ankle-below knee, 27 m/s, N>41),  decreased conduction velocity (above knee-below knee, 320m/s, N>41) . F Wave showed no response.  The left  Peroneal motor nerve showed prolonged distal onset latency(12.626ms, N<7.3), reduced amplitude(0.9317mV, N>2), decreased conduction velocity (ankle-below knee, 30 m/s, N>41),  decreased conduction velocity (above knee-below knee, 8120m/s, N>41) . F Wave showed no response.  The right Tibial motor nerve showed prolonged distal onset latency(6.369ms, N<6.9), reduced amplitude(0.365mV, N>3) and prolonged F Wave(70.1046ms, N<5158ms)  The left Tibial motor nerve showed prolonged distal onset latency(6.255ms, N<6.9), reduced amplitude(0.5783mV, N>3  and prolonged F Wave(74.476ms, N<3458ms)  Bilateral Sural and bilateral Superficial Peroneal sensory nerves showed no response.   EMG Needle study was performed on selected right upper and right lower extremity muscles:   The right Extensor Hallucis Longus muscle showed increased motor unit amplitude and diminished motor unit recruitment.  The right Abductor Hallucis muscle showed increased spontaneous activity(+1 psw) and diminished motor unit recruitment.The right Deltoid, right Triceps, right Biceps, right Pronator Teres, right Opponens Pollicis, right First Dorsal interosseous, right Vastus Medialis, right Anterior Tibialis, right Medial Gastrocnemius muscles were within normal limits.  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 Given her clinical history and family history, a diagnosis of consistent with Charcot Marie Tooth. Will refer to [facility] clinic and refer for genetic testing.   Naomie DeanAntonia Alishah Schulte, MD  [facility] [address] [place], [place] [address]  Phone [phone] Fax [phone]

## 2015-10-10 NOTE — Progress Notes (Signed)
See procedure note.

## 2015-10-12 DIAGNOSIS — Z23 Encounter for immunization: Secondary | ICD-10-CM | POA: Diagnosis not present

## 2015-10-12 DIAGNOSIS — F909 Attention-deficit hyperactivity disorder, unspecified type: Secondary | ICD-10-CM | POA: Diagnosis not present

## 2015-10-12 DIAGNOSIS — F411 Generalized anxiety disorder: Secondary | ICD-10-CM | POA: Diagnosis not present

## 2015-10-12 DIAGNOSIS — G4700 Insomnia, unspecified: Secondary | ICD-10-CM | POA: Diagnosis not present

## 2015-10-12 DIAGNOSIS — F4310 Post-traumatic stress disorder, unspecified: Secondary | ICD-10-CM | POA: Diagnosis not present

## 2015-10-12 DIAGNOSIS — Z30431 Encounter for routine checking of intrauterine contraceptive device: Secondary | ICD-10-CM | POA: Diagnosis not present

## 2015-10-12 DIAGNOSIS — K219 Gastro-esophageal reflux disease without esophagitis: Secondary | ICD-10-CM | POA: Diagnosis not present

## 2015-10-15 NOTE — Procedures (Signed)
[facility]    Provider:  Dr Lucia GaskinsAhern Referring Provider: Elias Elseeade, Robert, MD Primary Care Physician:  Lolita PatellaEADE,ROBERT ALEXANDER, MD  HPI:  Holly Lynch is a 30 y.o. 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 Patient has a significant family history of Charcot-Marie-Tooth in her MGM, mother and several sisters. Patient experiences neuropathic pain in the feet.  Summary  Nerve conduction studies were performed on the bilateral upper and lower extremities:  The right Median motor nerve showed prolonged onset latency(9.297ms, N<4.0) and prolonged F Wave latency(50.644ms, N<34). The left Median motor nerve showed prolonged distal onset latency(10.721ms, N<4.0) and delayed F Wave latency(49.246ms, N<34). The right Ulnar motor nerve showed showed prolonged distal onset latency(7.879ms, N<4.2), reduced amplitude(3.789mV, N>5), decreased conduction velocity (Elbow-Wrist, 33 m/s, N>47),  decreased conduction velocity (above elbow-below elbow, 37 m/s, N>45)  and delayed F Wave latency(54.236ms, N<34). The left Ulnar motor nerve showed showed prolonged distal onset latency(9.312ms, N<4.2), reduced amplitude(1.220mV, N>5)  and delayed F Wave latency(51.714ms, N<34).  The right Median sensory nerve showed prolonged distal peak latency(9.793ms, N<3.9) and reduced amplitude(5vV,N>10) The left Median sensory nerve showed prolonged distal peak latency(7.519ms, N<3.9) and reduced amplitude(5vV,N>10)   The right Ulnar sensory nerve showed prolonged distal peak latency(5.918ms, N<3.5) and reduced amplitude(5vV,N>10) The left Ulnar sensory nerve showed prolonged distal peak latency(5.659ms, N<3.5) and reduced amplitude(5vV,N>10)  The right Peroneal motor nerve showed prolonged distal onset latency(9.613ms, N<7.3), reduced amplitude(81mV, N>2), decreased conduction velocity (ankle-below knee, 27 m/s, N>41),  decreased conduction velocity (above knee-below knee, 7285m/s, N>41) . F Wave showed no response.  The left Peroneal  motor nerve showed prolonged distal onset latency(12.426ms, N<7.3), reduced amplitude(0.6317mV, N>2), decreased conduction velocity (ankle-below knee, 30 m/s, N>41),  decreased conduction velocity (above knee-below knee, 8585m/s, N>41) . F Wave showed no response.  The right Tibial motor nerve showed prolonged distal onset latency(6.839ms, N<6.9), reduced amplitude(0.705mV, N>3) and prolonged F Wave(70.356ms, N<1858ms)  The left Tibial motor nerve showed prolonged distal onset latency(6.895ms, N<6.9), reduced amplitude(0.6983mV, N>3  and prolonged F Wave(74.676ms, N<2458ms)  Bilateral Sural and bilateral Superficial Peroneal sensory nerves showed no response.   EMG Needle study was performed on selected right upper and right lower extremity muscles:   The right Extensor Hallucis Longus muscle showed increased motor unit amplitude and diminished motor unit recruitment.  The right Abductor Hallucis muscle showed increased spontaneous activity(+1 psw) and diminished motor unit recruitment.The right Deltoid, right Triceps, right Biceps, right Pronator Teres, right Opponens Pollicis, right First Dorsal interosseous, right Vastus Medialis, right Anterior Tibialis, right Medial Gastrocnemius muscles were within normal limits.  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 Will refer to [facility] and refer for genetic testing.   Holly DeanAntonia Ahern, MD  [facility] [address] [place], [place] [address]  Phone [phone] Fax [phone]

## 2015-10-16 ENCOUNTER — Encounter: Payer: Self-pay | Admitting: *Deleted

## 2015-10-16 NOTE — Progress Notes (Signed)
Faxed completed order form to [facility] for lab test code [id number] (Charcot-Marie-tooth disease comprehensive panel). Included letter of medical necessity. Fax: [phone]. Received confirmation.

## 2015-10-18 DIAGNOSIS — Z0100 Encounter for examination of eyes and vision without abnormal findings: Secondary | ICD-10-CM | POA: Diagnosis not present

## 2015-11-23 DIAGNOSIS — N898 Other specified noninflammatory disorders of vagina: Secondary | ICD-10-CM | POA: Diagnosis not present

## 2015-11-23 DIAGNOSIS — K219 Gastro-esophageal reflux disease without esophagitis: Secondary | ICD-10-CM | POA: Diagnosis not present

## 2015-11-23 DIAGNOSIS — F909 Attention-deficit hyperactivity disorder, unspecified type: Secondary | ICD-10-CM | POA: Diagnosis not present

## 2015-11-23 DIAGNOSIS — G4700 Insomnia, unspecified: Secondary | ICD-10-CM | POA: Diagnosis not present

## 2015-11-23 DIAGNOSIS — Z113 Encounter for screening for infections with a predominantly sexual mode of transmission: Secondary | ICD-10-CM | POA: Diagnosis not present

## 2015-11-23 DIAGNOSIS — R635 Abnormal weight gain: Secondary | ICD-10-CM | POA: Diagnosis not present

## 2015-11-23 DIAGNOSIS — Z01411 Encounter for gynecological examination (general) (routine) with abnormal findings: Secondary | ICD-10-CM | POA: Diagnosis not present

## 2015-11-23 DIAGNOSIS — F4310 Post-traumatic stress disorder, unspecified: Secondary | ICD-10-CM | POA: Diagnosis not present

## 2015-11-23 DIAGNOSIS — F411 Generalized anxiety disorder: Secondary | ICD-10-CM | POA: Diagnosis not present

## 2015-11-23 DIAGNOSIS — D071 Carcinoma in situ of vulva: Secondary | ICD-10-CM | POA: Diagnosis not present

## 2015-12-03 ENCOUNTER — Telehealth: Payer: Self-pay | Admitting: Gynecologic Oncology

## 2015-12-03 NOTE — Telephone Encounter (Signed)
Pt returned my call. Appt scheduled w/Rossi for 11/29 at 315pm.

## 2015-12-03 NOTE — Telephone Encounter (Signed)
Lft vm to contact the patient for an appointment.

## 2015-12-03 NOTE — Telephone Encounter (Signed)
Spoke to CheneyMichelle and gave her an appt for the pt to see Dr. Andrey Farmerossi on 11/29 at 3:15pm. Michellet states that she will call and I will continue to call the pt as well.

## 2015-12-19 ENCOUNTER — Ambulatory Visit: Payer: 59 | Attending: Gynecologic Oncology | Admitting: Gynecologic Oncology

## 2015-12-19 ENCOUNTER — Telehealth: Payer: Self-pay | Admitting: Neurology

## 2015-12-19 ENCOUNTER — Encounter: Payer: Self-pay | Admitting: Gynecologic Oncology

## 2015-12-19 VITALS — BP 130/79 | HR 108 | Temp 98.6°F | Resp 18 | Ht 67.0 in | Wt 211.4 lb

## 2015-12-19 DIAGNOSIS — Z833 Family history of diabetes mellitus: Secondary | ICD-10-CM | POA: Insufficient documentation

## 2015-12-19 DIAGNOSIS — F419 Anxiety disorder, unspecified: Secondary | ICD-10-CM | POA: Diagnosis not present

## 2015-12-19 DIAGNOSIS — Z79899 Other long term (current) drug therapy: Secondary | ICD-10-CM | POA: Insufficient documentation

## 2015-12-19 DIAGNOSIS — Z8249 Family history of ischemic heart disease and other diseases of the circulatory system: Secondary | ICD-10-CM | POA: Diagnosis not present

## 2015-12-19 DIAGNOSIS — G43009 Migraine without aura, not intractable, without status migrainosus: Secondary | ICD-10-CM

## 2015-12-19 DIAGNOSIS — G600 Hereditary motor and sensory neuropathy: Secondary | ICD-10-CM

## 2015-12-19 DIAGNOSIS — R350 Frequency of micturition: Secondary | ICD-10-CM | POA: Diagnosis not present

## 2015-12-19 DIAGNOSIS — K219 Gastro-esophageal reflux disease without esophagitis: Secondary | ICD-10-CM | POA: Diagnosis not present

## 2015-12-19 DIAGNOSIS — G4700 Insomnia, unspecified: Secondary | ICD-10-CM | POA: Insufficient documentation

## 2015-12-19 DIAGNOSIS — H53133 Sudden visual loss, bilateral: Secondary | ICD-10-CM

## 2015-12-19 DIAGNOSIS — Z9889 Other specified postprocedural states: Secondary | ICD-10-CM | POA: Diagnosis not present

## 2015-12-19 DIAGNOSIS — N9089 Other specified noninflammatory disorders of vulva and perineum: Secondary | ICD-10-CM

## 2015-12-19 DIAGNOSIS — N909 Noninflammatory disorder of vulva and perineum, unspecified: Secondary | ICD-10-CM | POA: Diagnosis not present

## 2015-12-19 DIAGNOSIS — Z818 Family history of other mental and behavioral disorders: Secondary | ICD-10-CM | POA: Diagnosis not present

## 2015-12-19 DIAGNOSIS — K449 Diaphragmatic hernia without obstruction or gangrene: Secondary | ICD-10-CM | POA: Diagnosis not present

## 2015-12-19 DIAGNOSIS — G5603 Carpal tunnel syndrome, bilateral upper limbs: Secondary | ICD-10-CM

## 2015-12-19 NOTE — Patient Instructions (Signed)
We will call you with Biopsy results and schedule follow up depending on results.

## 2015-12-19 NOTE — Telephone Encounter (Signed)
Pt called to advise she has insurance now and [facility]. She has moved and it would be closer to go there

## 2015-12-19 NOTE — Telephone Encounter (Signed)
Placed referral per pt request. Received VO from AA,MD to place referral.

## 2015-12-20 ENCOUNTER — Encounter: Payer: Self-pay | Admitting: Gynecologic Oncology

## 2015-12-20 DIAGNOSIS — N904 Leukoplakia of vulva: Secondary | ICD-10-CM | POA: Diagnosis not present

## 2015-12-20 NOTE — Progress Notes (Signed)
Followup Note: Gyn-Onc  CC:  Chief Complaint  Patient presents with  . 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 Biopsied today - appears most consistent with hyperkeratosis of condyloma. If VIN3 is found, recommend excision vs laser. If negative for dysplasia I recommend 6 monthly surveillance visits with Dr Idamae SchullerVarnardo for 2 years before resuming annual visits again and consideration for topical steroids.  HPI: Holly Lynch is a 30 year old woman who is seen in consultation at the request of Dr Dion BodyVarnado for hyperkeratosis of the right anterior vulva.  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 She had a verrucous lesion biopsied in 2015 which was positive for condyloma. In October 2015 she underwent laser of the anterior labia. After she healed from the laser she noted a white area on the right anterior labia close the the clitoris. It makes masturbation painful. She has noted it to be increasing in size.  On 06/27/14 Dr Dion BodyVarnado performed a biopsy of the lesion which revealed only hyperkeratosis. No dysplasia was seen.    After discussion of treatment options with Dr. Andrey Farmerossi she elected for a wide local excision on the vulva was performed on July 25. Pathology revealed: Diagnosis 1. Labium, biopsy, Medial margin of right anterior labia minora - CONDYLOMA ACUMINATUM. - RESECTION MARGINS ARE NEGATIVE. 2. 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 - RESECTION MARGINS ARE NEGATIVE.  Postoperatively she had some wound separation. No infection.  Interval Hx: she developed mutliple "tears" anteriorally (inferior to clitoris) and nodular white area. It has been treated with TCA and laser but is persistent.  Current Meds:  Outpatient Encounter Prescriptions as of 12/19/2015   Medication Sig  . Cholecalciferol (VITAMIN D3) 5000 units TABS Take by mouth.  . citalopram (CELEXA) 40 MG tablet Take 40 mg by mouth every morning.   . conjugated estrogens (PREMARIN) vaginal cream Place 1 Applicatorful vaginally daily. Use externally as directed. (Patient taking differently: Place 1 Applicatorful vaginally daily as needed. Use externally as directed.)  . cyclobenzaprine (FLEXERIL) 10 MG tablet Take 1 tablet (10 mg total) by mouth 3 (three) times daily as needed for muscle spasms.  Marland Kitchen. lisdexamfetamine (VYVANSE) 70 MG capsule Take 50 mg by mouth every morning.   . Multiple Vitamins-Minerals (HM MULTIVITAMIN ADULT GUMMY) CHEW Chew 1 tablet by mouth daily.  . Omega-3 Fatty Acids (FISH OIL PO) Take by mouth.  . pantoprazole (PROTONIX) 40 MG tablet Take 40 mg by mouth every morning.   . ranitidine (ZANTAC) 300 MG tablet Take 300 mg by mouth at bedtime.  . traZODone (DESYREL) 50 MG tablet Take 100 mg by mouth at bedtime.   Marland Kitchen. HYDROcodone-acetaminophen (NORCO/VICODIN) 5-325 MG per tablet Take 1 tablet by mouth every 6 (six) hours as needed for moderate pain (for cluster headaches).  Marland Kitchen. ibuprofen (ADVIL,MOTRIN) 800 MG tablet Take 1 tablet (800 mg total) by mouth every 8 (eight) hours as needed. (Patient not taking: Reported on 12/19/2015)  . oxyCODONE-acetaminophen (PERCOCET/ROXICET) 5-325 MG tablet Take by mouth.  . SUMAtriptan (IMITREX) 100 MG tablet 100 mg as needed.   . SUMAtriptan (IMITREX) 20 MG/ACT nasal spray Place 1 spray (20 mg total) into the nose once. May repeat in 2 hours once if headache persists or recurs. (Patient not taking: Reported on 12/19/2015)  . valACYclovir (VALTREX) 500 MG tablet Take 500 mg  by mouth as needed.   . [DISCONTINUED] dicyclomine (BENTYL) 10 MG capsule Take 10 mg by mouth 2 (two) times daily as needed.    No facility-administered encounter medications on file as of 12/19/2015.     Allergy: No Known Allergies  Social Hx:   Social History    Social History  . Marital status: Single    Spouse name: N/A  . Number of children: 0  . Years of education: 20   Occupational History  . [employer]- nurse    Social History Main Topics  . Smoking status: Never Smoker  . Smokeless tobacco: Never Used  . Alcohol use 1.0 oz/week    2 Standard drinks or equivalent per week     Comment: social   . Drug use: No  . Sexual activity: Not Currently   Other Topics Concern  . Not on file   Social History Narrative   Lives with sister   Caffeine use: soda/excedrin ocassional     Past Surgical Hx:  Past Surgical History:  Procedure Laterality Date  . BIOPSY N/A 09/28/2013   Procedure: VULVAR BIOPSY;  Surgeon: Geryl Rankins, MD;  Location: [facility];  Service: Gynecology;  Laterality: N/A;  . COLONOSCOPY  08/31/14   Dr. Dulce Sellar  . LASER ABLATION CONDOLAMATA N/A 09/28/2013   Procedure: CO2 LASER ABLATION CONDYLOMA;  Surgeon: Geryl Rankins, MD;  Location: [facility];  Service: Gynecology;  Laterality: N/A;  . METATARSAL OSTEOTOMY WITH BUNIONECTOMY Bilateral left 05-16-2014//   right 12/ 2014  . NEGATIVE SLEEP STUDY  12-14-2010  . TONSILLECTOMY  2000  . VULVECTOMY N/A 08/14/2014   Procedure: WIDE LOCAL EXCISION VULVA;  Surgeon: Adolphus Birchwood, MD;  Location[facility];  Service: Gynecology;  Laterality: N/A;  . WISDOM TOOTH EXTRACTION  1998    Past Medical Hx:  Past Medical History:  Diagnosis Date  . ADD (attention deficit disorder)   . Anxiety   . Cluster headaches   . Frequency of urination   . Genital herpes simplex type 2   . GERD (gastroesophageal reflux disease)   . Hiatal hernia 08/31/14   colonoscopy done 8/11 by Dr. Dulce Sellar  . History of condyloma acuminatum    vulva  . History of esophagitis    grade b  . Hyperkeratosis    RIGHT ANTERIOR VULVA  . Insomnia   . Mass of labium   . Wears contact lenses     Past Gynecological History:  HSV and papilloma/condyloma, s/p laser of vulva. No LMP recorded.  Family  Hx:  Family History  Problem Relation Age of Onset  . Charcot-Marie-Tooth disease Mother   . Diabetes Paternal Aunt   . Charcot-Marie-Tooth disease Maternal Grandmother   . Heart disease Maternal Grandfather   . AAA (abdominal aortic aneurysm) Maternal Grandfather   . Glaucoma Maternal Grandfather   . Diabetes Paternal Grandmother   . Prostate cancer Paternal Grandfather   . Diabetes Paternal Grandfather   . Hypothyroidism Sister   . Bipolar disorder Sister   . Fibromyalgia Sister   . Narcolepsy Brother    Vitals:  Blood pressure 130/79, pulse (!) 108, temperature 98.6 F (37 C), temperature source Oral, resp. rate 18, height 5\' 7"  (1.702 m), weight 211 lb 6.4 oz (95.9 kg), SpO2 100 %.  Physical Exam:  pelvic: There is a 3cm area of leukoplakia that is dense and hard like hyperkeratosis inferior to the clitoris. Biopsy was performed see below.  Procedure: Vulva biopsy Verbal consent was obtained.  Indication:  vulvar lesions Procedure: The skin was prepped with betadine. The area was infiltrated with 1cc of 1% lidocaine. A 2mm punch biopsy was taken bilaterally on the left and right anterior labia minora. Hemostasis was obtained with silver nitrate.  EBL <5cc. Condition: stable.  Pathology: left and right labial biopsies.   Quinn Axeossi, Shontez Sermon Caroline, MD   12/20/2015, 5:24 PM

## 2015-12-21 ENCOUNTER — Telehealth: Payer: Self-pay | Admitting: Gynecologic Oncology

## 2015-12-21 NOTE — Telephone Encounter (Signed)
Unable to leave message.  Attempted to call patient with biopsy results.

## 2015-12-28 ENCOUNTER — Telehealth: Payer: Self-pay

## 2015-12-28 NOTE — Telephone Encounter (Signed)
Discussed normal test result with patient and plan to follow up w/ GYN yearly. Pt verbalized understandnig.

## 2017-03-10 ENCOUNTER — Telehealth: Payer: Self-pay | Admitting: *Deleted

## 2017-03-10 NOTE — Telephone Encounter (Signed)
Per medical record release form and Melissa APP I have faxed records to [facility].
